# Patient Record
Sex: Female | Born: 1945 | Race: Black or African American | Hispanic: No | Smoking: Former smoker
Health system: Southern US, Community
[De-identification: ages and names within clinical notes are randomized; demographics above are authoritative.]

## PROBLEM LIST (undated history)

## (undated) DIAGNOSIS — K219 Gastro-esophageal reflux disease without esophagitis: Secondary | ICD-10-CM

## (undated) DIAGNOSIS — I10 Essential (primary) hypertension: Secondary | ICD-10-CM

## (undated) DIAGNOSIS — E785 Hyperlipidemia, unspecified: Secondary | ICD-10-CM

## (undated) DIAGNOSIS — E119 Type 2 diabetes mellitus without complications: Secondary | ICD-10-CM

## (undated) DIAGNOSIS — M19011 Primary osteoarthritis, right shoulder: Secondary | ICD-10-CM

## (undated) DIAGNOSIS — C801 Malignant (primary) neoplasm, unspecified: Secondary | ICD-10-CM

## (undated) DIAGNOSIS — M19012 Primary osteoarthritis, left shoulder: Secondary | ICD-10-CM

## (undated) HISTORY — DX: Hyperlipidemia, unspecified: E78.5

## (undated) HISTORY — DX: Essential (primary) hypertension: I10

## (undated) HISTORY — DX: Primary osteoarthritis, right shoulder: M19.012

## (undated) HISTORY — PX: NO PAST SURGERIES: SHX2092

## (undated) HISTORY — DX: Primary osteoarthritis, right shoulder: M19.011

## (undated) HISTORY — DX: Type 2 diabetes mellitus without complications: E11.9

## (undated) HISTORY — PX: FRACTURE SURGERY: SHX138

---

## 1999-09-25 ENCOUNTER — Encounter: Admission: RE | Admit: 1999-09-25 | Discharge: 1999-09-25 | Payer: Self-pay | Admitting: Family Medicine

## 1999-09-25 ENCOUNTER — Encounter: Payer: Self-pay | Admitting: Family Medicine

## 2003-04-10 LAB — HM COLONOSCOPY: HM Colonoscopy: NORMAL

## 2006-11-08 HISTORY — PX: FOOT FRACTURE SURGERY: SHX645

## 2006-11-26 ENCOUNTER — Emergency Department (HOSPITAL_COMMUNITY): Admission: EM | Admit: 2006-11-26 | Discharge: 2006-11-26 | Payer: Self-pay | Admitting: Emergency Medicine

## 2006-12-04 ENCOUNTER — Ambulatory Visit (HOSPITAL_BASED_OUTPATIENT_CLINIC_OR_DEPARTMENT_OTHER): Admission: RE | Admit: 2006-12-04 | Discharge: 2006-12-04 | Payer: Self-pay | Admitting: Orthopedic Surgery

## 2007-03-12 ENCOUNTER — Encounter: Payer: Self-pay | Admitting: Internal Medicine

## 2007-03-12 ENCOUNTER — Other Ambulatory Visit: Admission: RE | Admit: 2007-03-12 | Discharge: 2007-03-12 | Payer: Self-pay | Admitting: Internal Medicine

## 2007-03-12 ENCOUNTER — Ambulatory Visit: Payer: Self-pay | Admitting: Internal Medicine

## 2007-03-12 DIAGNOSIS — K219 Gastro-esophageal reflux disease without esophagitis: Secondary | ICD-10-CM | POA: Insufficient documentation

## 2007-03-13 LAB — CONVERTED CEMR LAB
HDL: 40 mg/dL (ref >39.0)
LDL Cholesterol: 128 mg/dL — ABNORMAL HIGH (ref 0–99)
Triglycerides: 103 mg/dL (ref 0–149)
VLDL: 21 mg/dL (ref 0–40)

## 2007-03-19 ENCOUNTER — Encounter (INDEPENDENT_AMBULATORY_CARE_PROVIDER_SITE_OTHER): Payer: Self-pay | Admitting: *Deleted

## 2007-03-20 ENCOUNTER — Ambulatory Visit (HOSPITAL_COMMUNITY): Admission: RE | Admit: 2007-03-20 | Discharge: 2007-03-20 | Payer: Self-pay | Admitting: Internal Medicine

## 2007-03-24 ENCOUNTER — Encounter (INDEPENDENT_AMBULATORY_CARE_PROVIDER_SITE_OTHER): Payer: Self-pay | Admitting: *Deleted

## 2007-04-14 ENCOUNTER — Ambulatory Visit: Payer: Self-pay | Admitting: Gastroenterology

## 2007-04-24 ENCOUNTER — Encounter: Payer: Self-pay | Admitting: Internal Medicine

## 2007-04-24 ENCOUNTER — Ambulatory Visit: Payer: Self-pay | Admitting: Gastroenterology

## 2007-04-24 LAB — HM COLONOSCOPY: HM Colonoscopy: NORMAL

## 2008-10-13 ENCOUNTER — Ambulatory Visit: Payer: Self-pay | Admitting: Internal Medicine

## 2008-10-13 LAB — CONVERTED CEMR LAB
Albumin: 4 g/dL (ref 3.5–5.2)
Calcium: 9.4 mg/dL (ref 8.4–10.5)
Creatinine, Ser: 0.7 mg/dL (ref 0.4–1.2)
Glucose, Bld: 111 mg/dL — ABNORMAL HIGH (ref 70–99)

## 2008-10-20 ENCOUNTER — Ambulatory Visit (HOSPITAL_COMMUNITY): Admission: RE | Admit: 2008-10-20 | Discharge: 2008-10-20 | Payer: Self-pay | Admitting: Internal Medicine

## 2008-10-21 ENCOUNTER — Encounter: Payer: Self-pay | Admitting: Internal Medicine

## 2009-12-26 ENCOUNTER — Ambulatory Visit: Payer: Self-pay | Admitting: Internal Medicine

## 2009-12-26 ENCOUNTER — Other Ambulatory Visit: Admission: RE | Admit: 2009-12-26 | Discharge: 2009-12-26 | Payer: Self-pay | Admitting: Internal Medicine

## 2009-12-26 DIAGNOSIS — M1990 Unspecified osteoarthritis, unspecified site: Secondary | ICD-10-CM | POA: Insufficient documentation

## 2009-12-27 ENCOUNTER — Ambulatory Visit (HOSPITAL_COMMUNITY): Admission: RE | Admit: 2009-12-27 | Discharge: 2009-12-27 | Payer: Self-pay | Admitting: Internal Medicine

## 2009-12-27 LAB — CONVERTED CEMR LAB
Albumin: 4.4 g/dL (ref 3.5–5.2)
BUN: 18 mg/dL (ref 6–23)
Creatinine, Ser: 0.7 mg/dL (ref 0.4–1.2)
Glucose, Bld: 103 mg/dL — ABNORMAL HIGH (ref 70–99)
Phosphorus: 4.3 mg/dL (ref 2.3–4.6)
TSH: 2.26 microintl units/mL (ref 0.35–5.50)
Total Protein: 7.5 g/dL (ref 6.0–8.3)

## 2009-12-28 ENCOUNTER — Encounter: Payer: Self-pay | Admitting: Internal Medicine

## 2009-12-28 LAB — CONVERTED CEMR LAB: Pap Smear: NEGATIVE

## 2010-04-27 ENCOUNTER — Ambulatory Visit
Admission: RE | Admit: 2010-04-27 | Discharge: 2010-04-27 | Payer: Self-pay | Source: Home / Self Care | Attending: Internal Medicine | Admitting: Internal Medicine

## 2010-04-27 DIAGNOSIS — K644 Residual hemorrhoidal skin tags: Secondary | ICD-10-CM | POA: Insufficient documentation

## 2010-05-09 NOTE — Letter (Signed)
Summary: Results Follow up Letter  [facility]  [address] [place], [place] [address]   Phone: [phone]  Fax: [phone]    12/28/2009 MRN: [id number]  [facility] [address] [place], [place]  [address]  Dear Ms. SPEEDY,  The following are the results of your recent test(s):  Test         Result    Pap Smear:        Normal __X___  Not Normal _____ Comments: ______________________________________________________ Cholesterol: LDL(Bad cholesterol):         Your goal is less than:         HDL (Good cholesterol):       Your goal is more than: Comments:  ______________________________________________________ Mammogram:        Normal _____  Not Normal _____ Comments:  ___________________________________________________________________ Hemoccult:        Normal _____  Not normal _______ Comments:    _____________________________________________________________________ Other Tests:    We routinely do not discuss normal results over the telephone.  If you desire a copy of the results, or you have any questions about this information we can discuss them at your next office visit.   Sincerely,      Tillman Abide, MD

## 2010-05-09 NOTE — Letter (Signed)
Summary: Results Follow up Letter  [facility]  [address] [place], [place] [address]   Phone: [phone]  Fax: [phone]    12/28/2009 MRN: [id number]  [facility] [address] [place], [place]  [address]  Dear Ms. SCHROM,  The following are the results of your recent test(s):  Test         Result    Pap Smear:        Normal _____  Not Normal _____ Comments: ______________________________________________________ Cholesterol: LDL(Bad cholesterol):         Your goal is less than:         HDL (Good cholesterol):       Your goal is more than: Comments:  ______________________________________________________ Mammogram:        Normal __X___  Not Normal _____ Comments: mammo is fine repeat recommended in 1-2 years  ___________________________________________________________________ Hemoccult:        Normal _____  Not normal _______ Comments:    _____________________________________________________________________ Other Tests:    We routinely do not discuss normal results over the telephone.  If you desire a copy of the results, or you have any questions about this information we can discuss them at your next office visit.   Sincerely,      Tillman Abide, MD

## 2010-05-09 NOTE — Assessment & Plan Note (Signed)
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 [employer] hasn't tried any meds Normal ROM though No arm weakness  Glucose 111 last year--not fasting Hasn't eaten in 7 hours today  Due for PAP today  Preventive Screening-Counseling & Management  Alcohol-Tobacco     Smoking Status: current     Smoking Cessation Counseling: yes     Smoke Cessation Stage: contemplative     Packs/Day: <0.25  Comments: only smokes 1 cigarette a day---discussed that she just needs to give that last one up  Allergies: No Known Drug Allergies  Past History:  Past medical, surgical, family and social histories (including risk factors) reviewed for relevance to current acute and chronic problems.  Past Medical History: GERD Osteoarthritis  Past Surgical History: Reviewed history from 03/12/2007 and no changes required. Right foot repair after fracture  8/08  Family History: Reviewed history from 03/12/2007 and no changes required. Dad died from alcoholism  ~ age 18 Mom died in 33's. Had seizure disorder 1 brother and 3 sisters No CAD, DM HTN in 1 sister No breast or colon cancer  Social History: Reviewed history from 03/12/2007 and no changes required. 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 Eyes:  Denies double vision and vision loss-1 eye; recent eye exam. ENT:  Complains of ringing  in ears; denies decreased hearing; Needs some teeth pulled No dentures yest. CV:  Denies chest pain or discomfort, difficulty breathing at night, difficulty breathing while lying down, fainting, lightheadness, palpitations, and shortness of breath with exertion; walks with husband at times. Resp:  Complains of cough; denies shortness of breath; occ cough still smokes about 1 cigarette a day--trying to quit. GI:  Denies abdominal pain, bloody stools, change in bowel habits, dark tarry stools, indigestion, nausea, and vomiting. GU:  Complains of abnormal vaginal bleeding; denies dysuria and incontinence; No sex now--not a problem. MS:  Complains of joint pain and muscle aches; denies joint swelling; just left shoulder. Derm:  Denies lesion(s) and rash. Neuro:  Complains of tingling; denies headaches, numbness, and weakness; brief tingling in right thumb at times---only seconds. Psych:  Denies anxiety and depression. Heme:  Denies abnormal bruising and enlarge lymph nodes. Allergy:  Denies seasonal allergies and sneezing.  Physical Exam  General:  alert and normal appearance.   Eyes:  pupils equal, pupils round, pupils reactive to light, and no injection.   Ears:  R ear normal and L ear normal.   Mouth:  no erythema, no exudates, and no lesions.   Neck:  supple, no masses, no thyromegaly, no carotid bruits, and no cervical lymphadenopathy.   Breasts:  no abnormal thickening, no tenderness, and no adenopathy.  Mild cystic changes R>L Lungs:  normal respiratory effort, no intercostal retractions, no accessory muscle use, and normal breath sounds.  Heart:  normal rate, regular rhythm, no murmur, and no gallop.   Abdomen:  soft, non-tender, and no masses.   Genitalia:  normal introitus, no external lesions, no vaginal or cervical lesions, no friaility or hemorrhage, normal uterus size and position, and no adnexal masses or tenderness.   Msk:  no joint tenderness and no joint swelling.   Has  crepitus with passive ROM of left shoulder Pulses:  1+ in feet Extremities:  no sig edema Neurologic:  alert & oriented X3, strength normal in all extremities, and gait normal.   Skin:  no rashes and no suspicious lesions.   Psych:  normally interactive, good eye contact, not anxious appearing, and not depressed appearing.     Impression & Recommendations:  Problem # 1:  PREVENTIVE HEALTH CARE (ICD-V70.0) Assessment Comment Only Pap done due for mammo will recheck sugar discussed fitness  Problem # 2:  OSTEOARTHRITIS (ICD-715.90) Assessment: New  left shoulder  discussed tylenol before work  Her updated medication list for this problem includes:    Acetaminophen 650 Mg Cr-tabs (Acetaminophen) .Marland Kitchen... 1 tab up to four times daily for shoulder pain  Problem # 3:  GERD (ICD-530.81) Assessment: Unchanged  has been quiet will just check labs  Orders: TLB-Renal Function Panel (80069-RENAL) TLB-Hepatic/Liver Function Pnl (80076-HEPATIC) TLB-TSH (Thyroid Stimulating Hormone) (84443-TSH) Venipuncture (52841)  Complete Medication List: 1)  Centrum Silver Tabs (Multiple vitamins-minerals) .... Take 1 tablet by mouth once a day 2)  Acetaminophen 650 Mg Cr-tabs (Acetaminophen) .Marland Kitchen.. 1 tab up to four times daily for shoulder pain  Other Orders: Radiology Referral (Radiology)  Patient Instructions: 1)  Please try long acting acetominophen (tylenol) especially before work 2)  Please schedule a follow-up appointment in 1 year.  3)  Schedule your mammogram.   Current Allergies (reviewed today): No known allergies

## 2010-05-10 ENCOUNTER — Ambulatory Visit (INDEPENDENT_AMBULATORY_CARE_PROVIDER_SITE_OTHER): Payer: Commercial Managed Care - PPO | Admitting: Family Medicine

## 2010-05-10 ENCOUNTER — Encounter: Payer: Self-pay | Admitting: Family Medicine

## 2010-05-10 DIAGNOSIS — M25519 Pain in unspecified shoulder: Secondary | ICD-10-CM

## 2010-05-10 DIAGNOSIS — D179 Benign lipomatous neoplasm, unspecified: Secondary | ICD-10-CM

## 2010-05-11 NOTE — Assessment & Plan Note (Signed)
Summary: CHECK LUMP ON BACKSIDE/CLE   Vital Signs:  Patient profile:   65 year old female Weight:      183 pounds Temp:     98.4 degrees F oral Pulse rate:   70 / minute Pulse rhythm:   regular BP sitting:   164 / 87  (left arm) Cuff size:   regular  Vitals Entered By: Mervin Hack CMA Duncan Dull) (April 27, 2010 2:48 PM) CC: rash on bottom   History of Present Illness: Had some trouble with hemorrhoids around Thanksgiving Seemed to go down but has a lump there still Right at rectal opening  no pain no bleeding some itching Bowels have been normal  Allergies: No Known Drug Allergies  Past History:  Past medical, surgical, family and social histories (including risk factors) reviewed for relevance to current acute and chronic problems.  Past Medical History: Reviewed history from 12/26/2009 and no changes required. GERD Osteoarthritis  Past Surgical History: Reviewed history from 03/12/2007 and no changes required. Right foot repair after fracture  8/08  Family History: Reviewed history from 03/12/2007 and no changes required. Dad died from alcoholism  ~ age 68 Mom died in 57's. Had seizure disorder 1 brother and 3 sisters No CAD, DM HTN in 1 sister No breast or colon cancer  Social History: Reviewed history from 03/12/2007 and no changes required. 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   Rectal:  small apparent cyst to left of rectum and appears to be distinct from the rectum. Small hemorrhoid tag on the other side Internal shows no additional masses or hemorrhoids   Impression & Recommendations:  Problem # 1:  EXTERNAL HEMORRHOIDS (ICD-455.3) Assessment New small hemorrhoid but also a perirectal cyst that is not inflamed or worrisome at all  Complete Medication List: 1)   Centrum Silver Tabs (Multiple vitamins-minerals) .... Take 1 tablet by mouth once a day 2)  Acetaminophen 650 Mg Cr-tabs (Acetaminophen) .Marland Kitchen.. 1 tab up to four times daily for shoulder pain 3)  Anusol-hc 2.5 % Crea (Hydrocortisone) .... Apply three times a day to rectal area as needed for itching or discomfort  Patient Instructions: 1)  Please schedule a follow-up appointment as needed .  Prescriptions: ANUSOL-HC 2.5 % CREA (HYDROCORTISONE) apply three times a day to rectal area as needed for itching or discomfort  #1 tube x 2   Entered and Authorized by:   Cindee Salt MD   Signed by:   Cindee Salt MD on 04/27/2010   Method used:   Electronically to        [facility] [id number] (retail)       [address] [place], [place]  [address]       Ph: [phone]       Fax: [phone]   RxID:   [id number]    Orders Added: 1)  Est. Patient Level III [84132]    Current Allergies (reviewed today): No known allergies

## 2010-05-17 NOTE — Assessment & Plan Note (Signed)
Summary: CHECK LUMP ON BACK OF NECK/CLE  [facility]   Vital Signs:  Patient profile:   65 year old female Height:      66 inches Weight:      181.75 pounds BMI:     29.44 Temp:     98.5 degrees F oral Pulse rate:   84 / minute Pulse rhythm:   regular BP sitting:   142 / 70  (left arm) Cuff size:   regular  Vitals Entered By: Delilah Shan CMA Duncan Dull) (May 10, 2010 4:13 PM) CC: Check lump on back of neck   History of Present Illness: "Lump on back of neck."  Noted last night.  Not pain. Feeling well o/w except for B shoulder pain. Pain with certain movement, esp above the head.  Works with housekeeping at [employer].  Pain on laterial portion of bilateral shoulders.   Allergies: No Known Drug Allergies  Review of Systems       See HPI.  Otherwise negative.    Physical Exam  General:  no apparent distress normocephalic atraumatic mucous membranes moist neck supple soft mass on upper back right of midline, c/w lipoma B should pain with active range of motion to 90deg- can't abduct above that.  pain with int rotation bilaterally L>R, ext rotation with improved range of motion but still with more pain on the L.  impingment L>R.  No AC joint pain bilaterally.  distally nv intact.    Impression & Recommendations:  Problem # 1:  LIPOMA (ICD-214.9) No tx needed now, notify the clinic if enlarging or tender.  No LA and no work up needed.   Problem # 2:  SHOULDER PAIN, BILATERAL (ICD-719.41) D/w patient [id number].  I would use the tramadol with sedation caution.  No help with tylenol/ibuprofen prev.  If not improved, she may benefit from recheck by Dr. Patsy Lager or her PMD.  Will defer to patient to call back as needed.  She agrees.  The following medications were removed from the medication list:    Acetaminophen 650 Mg Cr-tabs (Acetaminophen) .Marland Kitchen... 1 tab up to four times daily for shoulder pain Her updated medication list for this problem includes:    Tramadol Hcl 50 Mg Tabs (Tramadol  hcl) .Marland Kitchen... 1 by mouth two times a day for pain, sedation caution  Orders: Prescription Created Electronically [phone])  Complete Medication List: 1)  Centrum Silver Tabs (Multiple vitamins-minerals) .... Take 1 tablet by mouth once a day 2)  Tramadol Hcl 50 Mg Tabs (Tramadol hcl) .Marland Kitchen.. 1 by mouth two times a day for pain, sedation caution  Patient Instructions: 1)  Call back if the should pain isn't getting better.  We may need to get you set up with Dr. Patsy Lager here at the clinic. 2)  Try the tramadol for pain.  It may make you drowsy. 3)  Let us know if the spot on your back is getting bigger.  We can get it cut out if needed.   Prescriptions: TRAMADOL HCL 50 MG TABS (TRAMADOL HCL) 1 by mouth two times a day for pain, sedation caution  #60 x 1   Entered and Authorized by:   Crawford Givens MD   Signed by:   Crawford Givens MD on 05/10/2010   Method used:   Electronically to        [facility] [id number] (retail)       [address]       [place]  [place], [place]  [address]       Ph: [phone]       Fax: [phone]   RxID:   [id number]    Orders Added: 1)  Est. Patient Level III [02725] 2)  Prescription Created Electronically [phone]    Current Allergies (reviewed today): No known allergies

## 2010-05-29 ENCOUNTER — Encounter: Payer: Self-pay | Admitting: Internal Medicine

## 2010-05-29 ENCOUNTER — Ambulatory Visit (INDEPENDENT_AMBULATORY_CARE_PROVIDER_SITE_OTHER): Payer: Commercial Managed Care - PPO | Admitting: Internal Medicine

## 2010-05-29 DIAGNOSIS — D179 Benign lipomatous neoplasm, unspecified: Secondary | ICD-10-CM

## 2010-06-06 NOTE — Assessment & Plan Note (Signed)
Summary: ?KNOT ON HIP/CLE   [facility]   Vital Signs:  Patient profile:   65 year old female Weight:      179 pounds Temp:     98.2 degrees F oral Pulse rate:   70 / minute Pulse rhythm:   regular BP sitting:   154 / 71  (left arm) Cuff size:   regular  Vitals Entered By: Mervin Hack CMA Duncan Dull) (May 29, 2010 12:19 PM) CC: knot on hip   History of Present Illness: Now has noted a lump on her right hip in the last few days Not there a couple of weeks ago No falls or trauma  No pain No trouble walking  No decreased ROM  Allergies: No Known Drug Allergies  Past History:  Past medical, surgical, family and social histories (including risk factors) reviewed for relevance to current acute and chronic problems.  Past Medical History: Reviewed history from 12/26/2009 and no changes required. GERD Osteoarthritis  Past Surgical History: Reviewed history from 03/12/2007 and no changes required. Right foot repair after fracture  8/08  Family History: Reviewed history from 03/12/2007 and no changes required. Dad died from alcoholism  ~ age 24 Mom died in 63's. Had seizure disorder 1 brother and 3 sisters No CAD, DM HTN in 1 sister No breast or colon cancer  Social History: Reviewed history from 03/12/2007 and no changes required. 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 Take 1 tablet by mouth once a day 2)  Tramadol Hcl 50 Mg Tabs (Tramadol hcl) .Marland Kitchen.. 1 by mouth two times a day for pain, sedation caution  Patient Instructions: 1)  Please schedule a follow-up appointment in 7-8 months for physical   Orders Added: 1)  Est. Patient Level III [04540]    Current Allergies (reviewed today): No known allergies

## 2010-08-22 NOTE — Op Note (Signed)
Monica Harrington, Monica Harrington               ACCOUNT NO.:  [id number]   MEDICAL RECORD NO.:  [id number]          PATIENT TYPE:  AMB   LOCATION:  [facility]                          FACILITY:  [facility]   PHYSICIAN:  Leonides Grills, M.D.     DATE OF BIRTH:  [birth year]   DATE OF PROCEDURE:  12/04/2006  DATE OF DISCHARGE:                               OPERATIVE REPORT   PREOPERATIVE DIAGNOSIS:  Right calcaneus fracture.   POSTOPERATIVE DIAGNOSIS:  Right calcaneus fracture.   OPERATION:  1. Partial excision, right calcaneus fracture.  2. Right sural nerve neurolysis.  3. Stress x-rays, right foot.   ANESTHESIA:  General.   SURGEON:  Leonides Grills, MD   ASSISTANT:  Evlyn Kanner, P.A.   ESTIMATED BLOOD LOSS:  Minimal.   TOURNIQUET TIME:  Approximately 20 minutes.   COMPLICATIONS:  None.   DISPOSITION:  Stable to PAR.   INDICATIONS:  A 65 year old female who sustained the above injury.  It  was discussed with her that we may do an ORIF versus excision of this  fragment.  We went over the risks, which include infection, nerve or  vessel injury, nonunion, union, hardware rotation, hardware failure,  persistent pain, worse pain, prolonged recovery, stiffness, arthritis,  were all explained, questions encouraged and answered.   OPERATIVE PROCEDURE:  The patient was brought to the operating room and  placed in supine position initially after adequate general endotracheal  tube anesthesia was administered with block as well as Ancef 1 g IV  piggyback.  The patient was then placed in sloppy lateral position on a  beanbag, all bony prominences well-padded with the operative side up.  The right lower extremity was then prepped and draped in a sterile  manner over a proximally-placed thigh tourniquet.  The limb was gravity-  exsanguinated.  The tourniquet was inflated to 290 mmHg.  A longitudinal  incision midline over the right calcaneocuboid joint was then made.  Dissection was carried down through skin.   Hemostasis was obtained.  Sural nerve and its respective branches were encountered.  This was  carefully dissected out and a formal sural nerve neurolysis was  performed to mobilize the nerve and to retract it out of harm's way  lightly.  The peroneus brevis tendon was then encountered and this was  again retracted out of harm's way as well.  We then entered the CC joint  and the fragment was encountered.  This was carefully dissected out and  it was found that this fragment did not involve much of the joint  surface and was carefully dissected out and removed.  Once this was done  we then obtained stress x-rays that showed no instability across the CC  joint and  complete excision of the fragment.  The area was copiously irrigated  with normal saline.  The tourniquet was deflated.  Hemostasis was  obtained.  Subcu was closed with 3-0 Vicryl, skin was closed with 4-0  nylon.  Sterile dressing was applied.  Hard-sole shoe was applied.  The  patient was stable to PAR.      Leonides Grills, M.D.  Electronically Signed     PB/MEDQ  D:  12/04/2006  T:  12/05/2006  Job:  [id number]

## 2011-01-19 LAB — POCT HEMOGLOBIN-HEMACUE
Hemoglobin: 14.5
Operator id: 208731

## 2013-03-30 ENCOUNTER — Other Ambulatory Visit (HOSPITAL_COMMUNITY): Payer: Self-pay | Admitting: Pulmonary Disease

## 2013-03-30 DIAGNOSIS — Z1231 Encounter for screening mammogram for malignant neoplasm of breast: Secondary | ICD-10-CM

## 2013-04-10 ENCOUNTER — Ambulatory Visit (HOSPITAL_COMMUNITY): Payer: Commercial Managed Care - PPO

## 2013-04-17 ENCOUNTER — Other Ambulatory Visit (HOSPITAL_COMMUNITY): Payer: Self-pay | Admitting: Pulmonary Disease

## 2013-04-17 ENCOUNTER — Ambulatory Visit (HOSPITAL_COMMUNITY)
Admission: RE | Admit: 2013-04-17 | Discharge: 2013-04-17 | Disposition: A | Discharge status: Home / Self Care | Payer: PRIVATE HEALTH INSURANCE | Source: Ambulatory Visit | Attending: Pulmonary Disease | Admitting: Pulmonary Disease

## 2013-04-17 DIAGNOSIS — Z1231 Encounter for screening mammogram for malignant neoplasm of breast: Secondary | ICD-10-CM

## 2013-04-17 LAB — HM MAMMOGRAPHY: HM MAMMO: NORMAL

## 2014-05-26 ENCOUNTER — Telehealth: Payer: Self-pay | Admitting: Internal Medicine

## 2014-05-26 NOTE — Telephone Encounter (Signed)
Pt wants to reestablish care with Dr. Silvio Pate.It has been since Feb 2012 since last appointment.  Is it ok to schedule?

## 2014-05-26 NOTE — Telephone Encounter (Signed)
That would be okay

## 2014-05-28 NOTE — Telephone Encounter (Signed)
Appointment 3/18 pt aware

## 2014-06-25 ENCOUNTER — Encounter: Payer: Self-pay | Admitting: Internal Medicine

## 2014-06-25 ENCOUNTER — Ambulatory Visit (INDEPENDENT_AMBULATORY_CARE_PROVIDER_SITE_OTHER): Payer: Medicare Other | Admitting: Internal Medicine

## 2014-06-25 ENCOUNTER — Encounter (INDEPENDENT_AMBULATORY_CARE_PROVIDER_SITE_OTHER): Payer: Self-pay

## 2014-06-25 VITALS — BP 158/86 | HR 74 | Temp 98.5°F | Ht 65.5 in | Wt 179.5 lb

## 2014-06-25 DIAGNOSIS — E785 Hyperlipidemia, unspecified: Secondary | ICD-10-CM | POA: Insufficient documentation

## 2014-06-25 DIAGNOSIS — M19011 Primary osteoarthritis, right shoulder: Secondary | ICD-10-CM

## 2014-06-25 DIAGNOSIS — M19041 Primary osteoarthritis, right hand: Secondary | ICD-10-CM | POA: Insufficient documentation

## 2014-06-25 DIAGNOSIS — Z720 Tobacco use: Secondary | ICD-10-CM

## 2014-06-25 DIAGNOSIS — M19012 Primary osteoarthritis, left shoulder: Secondary | ICD-10-CM

## 2014-06-25 DIAGNOSIS — F17210 Nicotine dependence, cigarettes, uncomplicated: Secondary | ICD-10-CM | POA: Insufficient documentation

## 2014-06-25 DIAGNOSIS — Z0000 Encounter for general adult medical examination without abnormal findings: Secondary | ICD-10-CM | POA: Insufficient documentation

## 2014-06-25 DIAGNOSIS — M19042 Primary osteoarthritis, left hand: Secondary | ICD-10-CM

## 2014-06-25 DIAGNOSIS — Z23 Encounter for immunization: Secondary | ICD-10-CM

## 2014-06-25 DIAGNOSIS — I10 Essential (primary) hypertension: Secondary | ICD-10-CM | POA: Insufficient documentation

## 2014-06-25 MED ORDER — LOSARTAN POTASSIUM-HCTZ 50-12.5 MG PO TABS: 1.0000 | ORAL_TABLET | Freq: Every day | ORAL | Status: DC

## 2014-06-25 MED ORDER — ATORVASTATIN CALCIUM 20 MG PO TABS: 20.0000 mg | ORAL_TABLET | Freq: Every day | ORAL | Status: DC

## 2014-06-25 NOTE — Assessment & Plan Note (Signed)
BP Readings from Last 3 Encounters:  06/25/14 158/86  05/29/10 154/71  05/10/10 142/70   Did well with losartan/HCTZ Will restart and check her in 6 weeks

## 2014-06-25 NOTE — Assessment & Plan Note (Signed)
Wants to restart primary prevention with atorvastatin

## 2014-06-25 NOTE — Progress Notes (Signed)
Subjective:    Patient ID: Monica Harrington, female    DOB: Aug 11, 1945, 69 y.o.   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   No current facility-administered medications on file prior to visit.    No Known Allergies  Past Medical History  Diagnosis Date  . Hyperlipidemia   . Hypertension   . Osteoarthritis of both shoulders     Past Surgical History  Procedure Laterality Date  . No past surgeries    . Foot fracture surgery Right 8/08    Family History  Problem Relation Age of Onset  . Alcohol abuse Father   . Hypertension Sister   . Cancer Neg Hx   . Diabetes Neg Hx   . Heart disease Neg Hx   . Hypertension Sister     History   Social History  . Marital Status: Widowed    Spouse Name: N/A  . Number of Children: N/A  . Years of Education: N/A   Occupational History  . Not on file.   Social History Main Topics  . Smoking status: Current Some Day Smoker  . Smokeless tobacco: Not on file  . Alcohol Use: 1.2 oz/week    2 Cans of beer per week  . Drug Use: Not on file  . Sexual Activity: Not on file   Other Topics Concern  . Not on file   Social History Narrative   Widowed ~2014      No living will   Would want sister to make health care decisions--- [name]   Would accept resuscitation   Not sure about tube feeds   Review of Systems  Constitutional: Negative for fatigue and unexpected weight change.  HENT: Positive for dental problem and tinnitus. Negative for hearing loss.      Needs to get teeth pulled on top  Eyes: Negative for visual disturbance.       No diplopia or unilateral vision loss  Respiratory: Negative for cough, chest tightness and shortness of breath.   Cardiovascular: Negative for chest pain, palpitations and leg swelling.  Gastrointestinal: Positive for constipation. Negative for nausea, vomiting, abdominal pain and blood in stool.       Will rarely need a stool softener  Endocrine: Negative for polydipsia and polyuria.  Genitourinary: Negative for dysuria and hematuria.  Musculoskeletal: Positive for arthralgias. Negative for back pain and joint swelling.  Skin: Negative for rash.  Allergic/Immunologic: Negative for environmental allergies and immunocompromised state.  Neurological: Negative for dizziness, syncope, light-headedness and headaches.  Hematological: Negative for adenopathy. Does not bruise/bleed easily.  Psychiatric/Behavioral: Negative for sleep disturbance and dysphoric mood. The patient is not nervous/anxious.        Objective:   Physical Exam  Constitutional: She is oriented to person, place, and time. She appears well-developed and well-nourished.  HENT:  Mouth/Throat: Oropharynx is clear and moist.  Neck: Normal range of motion. Neck supple. No thyromegaly present.  Cardiovascular: Normal rate, regular rhythm, normal heart sounds and intact distal pulses.  Exam reveals no gallop.  No murmur heard. Pulmonary/Chest: Effort normal and breath sounds normal. No respiratory distress. She has no wheezes. She has no rales.  Abdominal: Soft. There is no tenderness.  Musculoskeletal: She exhibits no edema or tenderness.  Lymphadenopathy:    She has no cervical adenopathy.  Neurological: She is alert and oriented to person, place, and time.  Skin: No rash noted. No erythema.  Psychiatric: She has a normal mood and affect. Her behavior is normal.          Assessment & Plan:

## 2014-06-25 NOTE — Assessment & Plan Note (Signed)
Only smokes every once in a while Urged her to stop completely and she indicated a desire to do so

## 2014-06-25 NOTE — Assessment & Plan Note (Signed)
prevnar today Colon due 2019 mammo due next year---she will set up UTD on tetanus No zostavax due to not having had chicken pox

## 2014-06-25 NOTE — Progress Notes (Signed)
Pre visit review using our clinic review tool, if applicable. No additional management support is needed unless otherwise documented below in the visit note. 

## 2014-06-25 NOTE — Addendum Note (Signed)
Addended by: Helene Shoe on: 06/25/2014 01:21 PM   Modules accepted: Orders

## 2014-06-25 NOTE — Assessment & Plan Note (Signed)
Mild Does well with topical Rx

## 2014-06-28 ENCOUNTER — Telehealth: Payer: Self-pay | Admitting: Internal Medicine

## 2014-06-28 NOTE — Telephone Encounter (Signed)
emmi mailed  °

## 2014-08-06 ENCOUNTER — Encounter: Payer: Self-pay | Admitting: Internal Medicine

## 2014-08-06 ENCOUNTER — Ambulatory Visit (INDEPENDENT_AMBULATORY_CARE_PROVIDER_SITE_OTHER): Payer: Medicare Other | Admitting: Internal Medicine

## 2014-08-06 VITALS — BP 130/60 | HR 69 | Temp 97.9°F | Ht 66.0 in | Wt 182.0 lb

## 2014-08-06 DIAGNOSIS — I10 Essential (primary) hypertension: Secondary | ICD-10-CM

## 2014-08-06 DIAGNOSIS — E785 Hyperlipidemia, unspecified: Secondary | ICD-10-CM

## 2014-08-06 LAB — T4, FREE: FREE T4: 0.62 ng/dL (ref 0.60–1.60)

## 2014-08-06 LAB — COMPREHENSIVE METABOLIC PANEL
ALT: 26 U/L (ref 0–35)
AST: 22 U/L (ref 0–37)
Albumin: 4.2 g/dL (ref 3.5–5.2)
Alkaline Phosphatase: 66 U/L (ref 39–117)
BILIRUBIN TOTAL: 0.3 mg/dL (ref 0.2–1.2)
BUN: 9 mg/dL (ref 6–23)
CALCIUM: 9.6 mg/dL (ref 8.4–10.5)
CHLORIDE: 104 meq/L (ref 96–112)
CO2: 28 mEq/L (ref 19–32)
CREATININE: 0.66 mg/dL (ref 0.40–1.20)
GFR: 114.9 mL/min (ref >60.00)
Glucose, Bld: 130 mg/dL — ABNORMAL HIGH (ref 70–99)
POTASSIUM: 3.8 meq/L (ref 3.5–5.1)
SODIUM: 139 meq/L (ref 135–145)
Total Protein: 7.3 g/dL (ref 6.0–8.3)

## 2014-08-06 LAB — LIPID PANEL
Cholesterol: 120 mg/dL (ref 0–200)
HDL: 33.4 mg/dL — AB (ref >39.00)
LDL Cholesterol: 67 mg/dL (ref 0–99)
NONHDL: 86.6
TRIGLYCERIDES: 96 mg/dL (ref 0.0–149.0)
Total CHOL/HDL Ratio: 4
VLDL: 19.2 mg/dL (ref 0.0–40.0)

## 2014-08-06 LAB — CBC WITH DIFFERENTIAL/PLATELET
Basophils Absolute: 0 10*3/uL (ref 0.0–0.1)
Basophils Relative: 0.4 % (ref 0.0–3.0)
Eosinophils Absolute: 0.2 10*3/uL (ref 0.0–0.7)
Eosinophils Relative: 1.7 % (ref 0.0–5.0)
HEMATOCRIT: 40.1 % (ref 36.0–46.0)
Hemoglobin: 13.1 g/dL (ref 12.0–15.0)
LYMPHS ABS: 3.1 10*3/uL (ref 0.7–4.0)
LYMPHS PCT: 26.6 % (ref 12.0–46.0)
MCHC: 32.8 g/dL (ref 30.0–36.0)
MCV: 90.5 fl (ref 78.0–100.0)
Monocytes Absolute: 0.6 10*3/uL (ref 0.1–1.0)
Monocytes Relative: 5.4 % (ref 3.0–12.0)
NEUTROS ABS: 7.6 10*3/uL (ref 1.4–7.7)
NEUTROS PCT: 65.9 % (ref 43.0–77.0)
Platelets: 367 10*3/uL (ref 150.0–400.0)
RBC: 4.43 Mil/uL (ref 3.87–5.11)
RDW: 13.3 % (ref 11.5–15.5)
WBC: 11.5 10*3/uL — ABNORMAL HIGH (ref 4.0–10.5)

## 2014-08-06 NOTE — Progress Notes (Signed)
   Subjective:    Patient ID: Monica Harrington, female    DOB: 10-Mar-1946, 69 y.o.   MRN: [id number]  HPI Here for follow up of BP and cholesterol No problems going back on meds  Hasn't been checking BP but feels better No headaches No chest pain No SOB No dizziness or syncope Tries to walk with   Did stop smoking ~2 weeks ago  Current Outpatient Prescriptions on File Prior to Visit  Medication Sig Dispense Refill  . atorvastatin (LIPITOR) 20 MG tablet Take 1 tablet (20 mg total) by mouth daily. 90 tablet 3  . losartan-hydrochlorothiazide (HYZAAR) 50-12.5 MG per tablet Take 1 tablet by mouth daily. 90 tablet 3  . Multiple Vitamins-Minerals (CENTRUM SILVER ULTRA WOMENS PO) Take 1 tablet by mouth daily.     No current facility-administered medications on file prior to visit.    No Known Allergies  Past Medical History  Diagnosis Date  . Hyperlipidemia   . Hypertension   . Osteoarthritis of both shoulders     Past Surgical History  Procedure Laterality Date  . No past surgeries    . Foot fracture surgery Right 8/08    Family History  Problem Relation Age of Onset  . Alcohol abuse Father   . Hypertension Sister   . Cancer Neg Hx   . Diabetes Neg Hx   . Heart disease Neg Hx   . Hypertension Sister     History   Social History  . Marital Status: Widowed    Spouse Name: N/A  . Number of Children: N/A  . Years of Education: N/A   Occupational History  . Not on file.   Social History Main Topics  . Smoking status: Current Some Day Smoker  . Smokeless tobacco: Never Used  . Alcohol Use: 1.2 oz/week    2 Cans of beer per week  . Drug Use: Not on file  . Sexual Activity: Not on file   Other Topics Concern  . Not on file   Social History Narrative   Widowed ~2014      No living will   Would want sister to make health care decisions--- [name]   Would accept resuscitation   Not sure about tube feeds   Review of Systems No myalgias Appetite is very  good Tries to be careful with eating Weight stable Sleeps well    Objective:   Physical Exam  Constitutional: She appears well-developed and well-nourished. No distress.  Neck: Normal range of motion. Neck supple. No thyromegaly present.  Cardiovascular: Normal rate, regular rhythm, normal heart sounds and intact distal pulses.  Exam reveals no gallop.   No murmur heard. Pulmonary/Chest: Effort normal and breath sounds normal. No respiratory distress. She has no wheezes. She has no rales.  Musculoskeletal: She exhibits no edema.  Lymphadenopathy:    She has no cervical adenopathy.  Psychiatric: She has a normal mood and affect. Her behavior is normal.          Assessment & Plan:

## 2014-08-06 NOTE — Progress Notes (Signed)
Pre visit review using our clinic review tool, if applicable. No additional management support is needed unless otherwise documented below in the visit note. 

## 2014-08-06 NOTE — Assessment & Plan Note (Signed)
BP Readings from Last 3 Encounters:  08/06/14 130/60  06/25/14 158/86  05/29/10 154/71   Fine back on her med Due for labs

## 2014-08-06 NOTE — Assessment & Plan Note (Signed)
No problems with statin for primary prevention 

## 2014-08-09 ENCOUNTER — Encounter: Payer: Self-pay | Admitting: *Deleted

## 2014-10-20 ENCOUNTER — Encounter: Payer: Self-pay | Admitting: Gastroenterology

## 2015-06-11 ENCOUNTER — Other Ambulatory Visit: Payer: Self-pay | Admitting: Internal Medicine

## 2015-06-13 NOTE — Telephone Encounter (Signed)
Rxs sent electronically.  

## 2015-08-10 ENCOUNTER — Ambulatory Visit (INDEPENDENT_AMBULATORY_CARE_PROVIDER_SITE_OTHER): Payer: Medicare Other | Admitting: Internal Medicine

## 2015-08-10 ENCOUNTER — Encounter: Payer: Self-pay | Admitting: Internal Medicine

## 2015-08-10 VITALS — BP 112/66 | HR 66 | Temp 98.3°F | Ht 65.5 in | Wt 179.8 lb

## 2015-08-10 DIAGNOSIS — E785 Hyperlipidemia, unspecified: Secondary | ICD-10-CM | POA: Diagnosis not present

## 2015-08-10 DIAGNOSIS — Z7189 Other specified counseling: Secondary | ICD-10-CM | POA: Insufficient documentation

## 2015-08-10 DIAGNOSIS — K219 Gastro-esophageal reflux disease without esophagitis: Secondary | ICD-10-CM | POA: Diagnosis not present

## 2015-08-10 DIAGNOSIS — Z0000 Encounter for general adult medical examination without abnormal findings: Secondary | ICD-10-CM | POA: Diagnosis not present

## 2015-08-10 DIAGNOSIS — Z23 Encounter for immunization: Secondary | ICD-10-CM

## 2015-08-10 DIAGNOSIS — I10 Essential (primary) hypertension: Secondary | ICD-10-CM | POA: Diagnosis not present

## 2015-08-10 LAB — CBC WITH DIFFERENTIAL/PLATELET
BASOS PCT: 0.2 % (ref 0.0–3.0)
Basophils Absolute: 0 10*3/uL (ref 0.0–0.1)
EOS ABS: 0.1 10*3/uL (ref 0.0–0.7)
EOS PCT: 1 % (ref 0.0–5.0)
HCT: 40.9 % (ref 36.0–46.0)
Hemoglobin: 13.3 g/dL (ref 12.0–15.0)
LYMPHS PCT: 24.5 % (ref 12.0–46.0)
Lymphs Abs: 3.5 10*3/uL (ref 0.7–4.0)
MCHC: 32.6 g/dL (ref 30.0–36.0)
MCV: 90.4 fl (ref 78.0–100.0)
Monocytes Absolute: 0.8 10*3/uL (ref 0.1–1.0)
Monocytes Relative: 5.6 % (ref 3.0–12.0)
Neutro Abs: 9.7 10*3/uL — ABNORMAL HIGH (ref 1.4–7.7)
Neutrophils Relative %: 68.7 % (ref 43.0–77.0)
Platelets: 361 10*3/uL (ref 150.0–400.0)
RBC: 4.52 Mil/uL (ref 3.87–5.11)
RDW: 14.1 % (ref 11.5–15.5)
WBC: 14.1 10*3/uL — ABNORMAL HIGH (ref 4.0–10.5)

## 2015-08-10 LAB — LIPID PANEL
Cholesterol: 142 mg/dL (ref 0–200)
HDL: 35.6 mg/dL — ABNORMAL LOW (ref >39.00)
LDL CALC: 87 mg/dL (ref 0–99)
NonHDL: 106.41
TRIGLYCERIDES: 99 mg/dL (ref 0.0–149.0)
Total CHOL/HDL Ratio: 4
VLDL: 19.8 mg/dL (ref 0.0–40.0)

## 2015-08-10 LAB — COMPREHENSIVE METABOLIC PANEL
ALBUMIN: 4.4 g/dL (ref 3.5–5.2)
ALT: 21 U/L (ref 0–35)
AST: 20 U/L (ref 0–37)
Alkaline Phosphatase: 73 U/L (ref 39–117)
BUN: 12 mg/dL (ref 6–23)
CALCIUM: 9.9 mg/dL (ref 8.4–10.5)
CHLORIDE: 102 meq/L (ref 96–112)
CO2: 26 mEq/L (ref 19–32)
CREATININE: 0.7 mg/dL (ref 0.40–1.20)
GFR: 107.03 mL/min (ref >60.00)
Glucose, Bld: 266 mg/dL — ABNORMAL HIGH (ref 70–99)
POTASSIUM: 4.5 meq/L (ref 3.5–5.1)
Sodium: 137 mEq/L (ref 135–145)
Total Bilirubin: 0.3 mg/dL (ref 0.2–1.2)
Total Protein: 7.6 g/dL (ref 6.0–8.3)

## 2015-08-10 LAB — T4, FREE: Free T4: 0.7 ng/dL (ref 0.60–1.60)

## 2015-08-10 NOTE — Assessment & Plan Note (Signed)
See social history Has blank forms but hasn't done

## 2015-08-10 NOTE — Assessment & Plan Note (Signed)
BP Readings from Last 3 Encounters:  08/10/15 112/66  08/06/14 130/60  06/25/14 158/86   Good control No change needed

## 2015-08-10 NOTE — Addendum Note (Signed)
Addended by: Pilar Grammes on: 08/10/2015 10:38 AM   Modules accepted: Orders

## 2015-08-10 NOTE — Assessment & Plan Note (Signed)
Mild Rarely uses OTC ranitidine

## 2015-08-10 NOTE — Progress Notes (Signed)
Subjective:    Patient ID: MYKENNA SERVICE, female    DOB: 09/29/1945, 70 y.o.   MRN: [id number]  HPI Here for Medicare wellness and follow up of chronic medical conditions Reviewed form and advanced directives Reviewed other doctors-- none Went back to smoking---rare, only ~2 cigarettes a week. Counseled. No alcohol Vision and hearing are okay Independent with all instrumental ADLs--misunderstood form No falls No depression or anhedonia No hospitalizations or procedures No apparent cognitive problems  No chest pain No SOB No palpitations No edema No dizziness or syncope  No problems with statin No myalgias or GI problems She walks every day  Still has some shoulder pain Mostly just uses aspercreme Doesn't limit her activities  Current Outpatient Prescriptions on File Prior to Visit  Medication Sig Dispense Refill  . atorvastatin (LIPITOR) 20 MG tablet TAKE 1 TABLET EVERY DAY 90 tablet 0  . losartan-hydrochlorothiazide (HYZAAR) 50-12.5 MG tablet TAKE 1 TABLET BY MOUTH DAILY. 90 tablet 0  . Multiple Vitamins-Minerals (CENTRUM SILVER ULTRA WOMENS PO) Take 1 tablet by mouth daily.     No current facility-administered medications on file prior to visit.    No Known Allergies  Past Medical History  Diagnosis Date  . Hyperlipidemia   . Hypertension   . Osteoarthritis of both shoulders     Past Surgical History  Procedure Laterality Date  . No past surgeries    . Foot fracture surgery Right 8/08    Family History  Problem Relation Age of Onset  . Alcohol abuse Father   . Hypertension Sister   . Cancer Neg Hx   . Diabetes Neg Hx   . Heart disease Neg Hx   . Hypertension Sister     Social History   Social History  . Marital Status: Widowed    Spouse Name: N/A  . Number of Children: N/A  . Years of Education: N/A   Occupational History  . Not on file.   Social History Main Topics  . Smoking status: Current Some Day Smoker    Last Attempt to Quit:  07/23/2014  . Smokeless tobacco: Never Used  . Alcohol Use: 1.2 oz/week    2 Cans of beer per week  . Drug Use: Not on file  . Sexual Activity: Not on file   Other Topics Concern  . Not on file   Social History Narrative   Widowed ~2014      No living will   Would want sister to make health care decisions--- [name]   Would accept resuscitation   Not sure about tube feeds--but probably wouldn't want   Review of Systems Has broken tooth-- looking for a dentist Tells me now that she has been diagnosed with hiatal hernia. Rare heartburn---ranitidine helps No trouble swallowing Has ringing in ear---but hearing is okay. No vertigo. Sleeps fine Appetite is good Weight stable Wears seat belt Bowels are fine. No blood in stool Voids okay. No incontinence or hematuria No skin rash or suspicious lesions    Objective:   Physical Exam  Constitutional: She is oriented to person, place, and time. She appears well-developed and well-nourished. No distress.  HENT:  Mouth/Throat: Oropharynx is clear and moist. No oropharyngeal exudate.  Neck: Normal range of motion. Neck supple. No thyromegaly present.  Cardiovascular: Normal rate, regular rhythm, normal heart sounds and intact distal pulses.  Exam reveals no gallop.   No murmur heard. Pulmonary/Chest: Effort normal and breath sounds normal. No respiratory distress. She has no wheezes. She has  no rales.  Abdominal: Soft. There is no tenderness.  Musculoskeletal: She exhibits no edema or tenderness.  Lymphadenopathy:    She has no cervical adenopathy.  Neurological: She is alert and oriented to person, place, and time.  President-- "Daisy Floro, Obama, Bush" [phone] [id number] Recall 2/3  Skin: No rash noted. No erythema.  Psychiatric: She has a normal mood and affect. Her behavior is normal.          Assessment & Plan:

## 2015-08-10 NOTE — Assessment & Plan Note (Signed)
I have personally reviewed the Medicare Annual Wellness questionnaire and have noted 1. The patient's medical and social history 2. Their use of alcohol, tobacco or illicit drugs 3. Their current medications and supplements 4. The patient's functional ability including ADL's, fall risks, home safety risks and hearing or visual             impairment. 5. Diet and physical activities 6. Evidence for depression or mood disorders  The patients weight, height, BMI and visual acuity have been recorded in the chart I have made referrals, counseling and provided education to the patient based review of the above and I have provided the pt with a written personalized care plan for preventive services.  I have provided you with a copy of your personalized plan for preventive services. Please take the time to review along with your updated medication list.  Colon due 1/19 Due for screening mammo--she will set up Pneumovax today Discussed stopping the rare cigarettes

## 2015-08-10 NOTE — Assessment & Plan Note (Signed)
Satisfied with the primary prevention with statin

## 2015-08-10 NOTE — Progress Notes (Signed)
Pre visit review using our clinic review tool, if applicable. No additional management support is needed unless otherwise documented below in the visit note. 

## 2015-08-10 NOTE — Patient Instructions (Addendum)
Please set up your screening mammogram.  DASH Eating Plan DASH stands for "Dietary Approaches to Stop Hypertension." The DASH eating plan is a healthy eating plan that has been shown to reduce high blood pressure (hypertension). Additional health benefits may include reducing the risk of type 2 diabetes mellitus, heart disease, and stroke. The DASH eating plan may also help with weight loss. WHAT DO I NEED TO KNOW ABOUT THE DASH EATING PLAN? For the DASH eating plan, you will follow these general guidelines:  Choose foods with a percent daily value for sodium of less than 5% (as listed on the food label).  Use salt-free seasonings or herbs instead of table salt or sea salt.  Check with your health care provider or pharmacist before using salt substitutes.  Eat lower-sodium products, often labeled as "lower sodium" or "no salt added."  Eat fresh foods.  Eat more vegetables, fruits, and low-fat dairy products.  Choose whole grains. Look for the word "whole" as the first word in the ingredient list.  Choose fish and skinless chicken or [place] more often than red meat. Limit fish, poultry, and meat to 6 oz (170 g) each day.  Limit sweets, desserts, sugars, and sugary drinks.  Choose heart-healthy fats.  Limit cheese to 1 oz (28 g) per day.  Eat more home-cooked food and less restaurant, buffet, and fast food.  Limit fried foods.  Cook foods using methods other than frying.  Limit canned vegetables. If you do use them, rinse them well to decrease the sodium.  When eating at a restaurant, ask that your food be prepared with less salt, or no salt if possible. WHAT FOODS CAN I EAT? Seek help from a dietitian for individual calorie needs. Grains Whole grain or whole wheat bread. Brown rice. Whole grain or whole wheat pasta. Quinoa, bulgur, and whole grain cereals. Low-sodium cereals. Corn or whole wheat flour tortillas. Whole grain cornbread. Whole grain crackers. Low-sodium  crackers. Vegetables Fresh or frozen vegetables (raw, steamed, roasted, or grilled). Low-sodium or reduced-sodium tomato and vegetable juices. Low-sodium or reduced-sodium tomato sauce and paste. Low-sodium or reduced-sodium canned vegetables.  Fruits All fresh, canned (in natural juice), or frozen fruits. Meat and Other Protein Products Ground beef (85% or leaner), grass-fed beef, or beef trimmed of fat. Skinless chicken or [place]. Ground chicken or [place]. Pork trimmed of fat. All fish and seafood. Eggs. Dried beans, peas, or lentils. Unsalted nuts and seeds. Unsalted canned beans. Dairy Low-fat dairy products, such as skim or 1% milk, 2% or reduced-fat cheeses, low-fat ricotta or cottage cheese, or plain low-fat yogurt. Low-sodium or reduced-sodium cheeses. Fats and Oils Tub margarines without trans fats. Light or reduced-fat mayonnaise and salad dressings (reduced sodium). Avocado. Safflower, olive, or canola oils. Natural peanut or almond butter. Other Unsalted popcorn and pretzels. The items listed above may not be a complete list of recommended foods or beverages. Contact your dietitian for more options. WHAT FOODS ARE NOT RECOMMENDED? Grains White bread. White pasta. White rice. Refined cornbread. Bagels and croissants. Crackers that contain trans fat. Vegetables Creamed or fried vegetables. Vegetables in a cheese sauce. Regular canned vegetables. Regular canned tomato sauce and paste. Regular tomato and vegetable juices. Fruits Dried fruits. Canned fruit in light or heavy syrup. Fruit juice. Meat and Other Protein Products Fatty cuts of meat. Ribs, chicken wings, bacon, sausage, bologna, salami, chitterlings, fatback, hot dogs, bratwurst, and packaged luncheon meats. Salted nuts and seeds. Canned beans with salt. Dairy Whole or 2% milk, cream, half-and-half, and  cream cheese. Whole-fat or sweetened yogurt. Full-fat cheeses or blue cheese. Nondairy creamers and whipped toppings.  Processed cheese, cheese spreads, or cheese curds. Condiments Onion and garlic salt, seasoned salt, table salt, and sea salt. Canned and packaged gravies. Worcestershire sauce. Tartar sauce. Barbecue sauce. Teriyaki sauce. Soy sauce, including reduced sodium. Steak sauce. Fish sauce. Oyster sauce. Cocktail sauce. Horseradish. Ketchup and mustard. Meat flavorings and tenderizers. Bouillon cubes. Hot sauce. Tabasco sauce. Marinades. Taco seasonings. Relishes. Fats and Oils Butter, stick margarine, lard, shortening, ghee, and bacon fat. Coconut, palm kernel, or palm oils. Regular salad dressings. Other Pickles and olives. Salted popcorn and pretzels. The items listed above may not be a complete list of foods and beverages to avoid. Contact your dietitian for more information. WHERE CAN I FIND MORE INFORMATION? National Heart, Lung, and Blood Institute: www.nhlbi.nih.gov/health/health-topics/topics/dash/   This information is not intended to replace advice given to you by your health care provider. Make sure you discuss any questions you have with your health care provider.   Document Released: 03/15/2011 Document Revised: 04/16/2014 Document Reviewed: 01/28/2013 Elsevier Interactive Patient Education 2016 Else[employer] Inc.  

## 2015-08-11 ENCOUNTER — Other Ambulatory Visit: Payer: Self-pay | Admitting: Internal Medicine

## 2015-08-11 DIAGNOSIS — R739 Hyperglycemia, unspecified: Secondary | ICD-10-CM

## 2015-09-01 ENCOUNTER — Other Ambulatory Visit (INDEPENDENT_AMBULATORY_CARE_PROVIDER_SITE_OTHER): Payer: Medicare Other

## 2015-09-01 DIAGNOSIS — R739 Hyperglycemia, unspecified: Secondary | ICD-10-CM

## 2015-09-01 LAB — HEMOGLOBIN A1C: Hgb A1c MFr Bld: 11.5 % — ABNORMAL HIGH (ref 4.6–6.5)

## 2015-09-01 LAB — GLUCOSE, RANDOM: Glucose, Bld: 281 mg/dL — ABNORMAL HIGH (ref 70–99)

## 2015-09-01 NOTE — Addendum Note (Signed)
Addended by: Daralene Milch C on: 09/01/2015 10:01 AM   Modules accepted: Orders

## 2015-09-07 ENCOUNTER — Ambulatory Visit (INDEPENDENT_AMBULATORY_CARE_PROVIDER_SITE_OTHER): Payer: Medicare Other | Admitting: Internal Medicine

## 2015-09-07 ENCOUNTER — Encounter: Payer: Self-pay | Admitting: Internal Medicine

## 2015-09-07 VITALS — BP 118/84 | HR 75 | Temp 98.2°F | Wt 177.0 lb

## 2015-09-07 DIAGNOSIS — E1159 Type 2 diabetes mellitus with other circulatory complications: Secondary | ICD-10-CM | POA: Insufficient documentation

## 2015-09-07 DIAGNOSIS — E119 Type 2 diabetes mellitus without complications: Secondary | ICD-10-CM

## 2015-09-07 MED ORDER — METFORMIN HCL 1000 MG PO TABS: 1000.0000 mg | ORAL_TABLET | Freq: Two times a day (BID) | ORAL | Status: DC

## 2015-09-07 NOTE — Patient Instructions (Signed)
Please start the metformin with 1/2 tab twice a day (breakfast and supper). If you have no problems after a week, increase to 1 full tab twice a day. Stop all sugared drinks and concentrated sweets (like candy).

## 2015-09-07 NOTE — Progress Notes (Signed)
Pre visit review using our clinic review tool, if applicable. No additional management support is needed unless otherwise documented below in the visit note. 

## 2015-09-07 NOTE — Progress Notes (Signed)
   Subjective:    Patient ID: Monica Harrington, female    DOB: 1945-06-23, 70 y.o.   MRN: [id number]  HPI Here to discuss new diagnosis of diabetes Here with niece  She admits to eating candy at times Still drinks regular pepsi Does a little walking  Current Outpatient Prescriptions on File Prior to Visit  Medication Sig Dispense Refill  . atorvastatin (LIPITOR) 20 MG tablet TAKE 1 TABLET EVERY DAY 90 tablet 0  . losartan-hydrochlorothiazide (HYZAAR) 50-12.5 MG tablet TAKE 1 TABLET BY MOUTH DAILY. 90 tablet 0  . Multiple Vitamins-Minerals (CENTRUM SILVER ULTRA WOMENS PO) Take 1 tablet by mouth daily.     No current facility-administered medications on file prior to visit.    No Known Allergies  Past Medical History  Diagnosis Date  . Hyperlipidemia   . Hypertension   . Osteoarthritis of both shoulders     Past Surgical History  Procedure Laterality Date  . No past surgeries    . Foot fracture surgery Right 8/08    Family History  Problem Relation Age of Onset  . Alcohol abuse Father   . Hypertension Sister   . Cancer Neg Hx   . Diabetes Neg Hx   . Heart disease Neg Hx   . Hypertension Sister     Social History   Social History  . Marital Status: Widowed    Spouse Name: N/A  . Number of Children: N/A  . Years of Education: N/A   Occupational History  . Not on file.   Social History Main Topics  . Smoking status: Current Some Day Smoker    Last Attempt to Quit: 07/23/2014  . Smokeless tobacco: Never Used  . Alcohol Use: 1.2 oz/week    2 Cans of beer per week  . Drug Use: Not on file  . Sexual Activity: Not on file   Other Topics Concern  . Not on file   Social History Narrative   Widowed ~2014      No living will   Would want sister to make health care decisions--- [name]   Would accept resuscitation   Not sure about tube feeds--but probably wouldn't want   Review of Systems Usually moves bowels 3 times per week Appetite is okay      Objective:   Physical Exam  Psychiatric: She has a normal mood and affect. Her behavior is normal.          Assessment & Plan:

## 2015-09-07 NOTE — Assessment & Plan Note (Signed)
New diagnosis Discussed stopping sugared soda Start metformin Counseling with [facility] Entire 15 minute visit in counseling and care planning

## 2015-09-09 ENCOUNTER — Other Ambulatory Visit: Payer: Self-pay | Admitting: Internal Medicine

## 2015-09-10 ENCOUNTER — Other Ambulatory Visit: Payer: Self-pay | Admitting: Internal Medicine

## 2015-09-23 ENCOUNTER — Encounter: Payer: Self-pay | Admitting: *Deleted

## 2015-09-23 ENCOUNTER — Encounter: Payer: Medicare Other | Attending: Internal Medicine | Admitting: *Deleted

## 2015-09-23 VITALS — BP 140/72 | Ht 65.0 in | Wt 180.2 lb

## 2015-09-23 DIAGNOSIS — E119 Type 2 diabetes mellitus without complications: Secondary | ICD-10-CM | POA: Diagnosis not present

## 2015-09-23 NOTE — Progress Notes (Signed)
Diabetes Self-Management Education  Visit Type: First/Initial  Appt. Start Time: 0835 Appt. End Time: 0945  09/23/2015  [name], identified by name and date of birth, is a 70 y.o. female with a diagnosis of Diabetes: Type 2.   ASSESSMENT  Blood pressure 140/72, height 5\' 5"  (1.651 m), weight 180 lb 3.2 oz (81.738 kg). Body mass index is 29.99 kg/(m^2).      Diabetes Self-Management Education - 09/23/15 1017    Visit Information   Visit Type First/Initial   Initial Visit   Diabetes Type Type 2   Are you currently following a meal plan? No   Are you taking your medications as prescribed? Yes   Date Diagnosed 09/07/15   Health Coping   How would you rate your overall health? Good   Psychosocial Assessment   Patient Belief/Attitude about Diabetes Motivated to manage diabetes  "I feel fine"   Self-care barriers None   Self-management support Doctor's office;Family   Other persons present Family Member  Niece   Patient Concerns Nutrition/Meal planning;Medication   Special Needs None   Preferred Learning Style Auditory   Learning Readiness Change in progress   How often do you need to have someone help you when you read instructions, pamphlets, or other written materials from your doctor or pharmacy? 1 - Never   What is the last grade level you completed in school? 10th   Pre-Education Assessment   Patient understands the diabetes disease and treatment process. Needs Instruction   Patient understands incorporating nutritional management into lifestyle. Needs Instruction   Patient undertands incorporating physical activity into lifestyle. Needs Instruction   Patient understands using medications safely. Needs Instruction   Patient understands monitoring blood glucose, interpreting and using results Needs Instruction   Patient understands prevention, detection, and treatment of acute complications. Needs Instruction   Patient understands prevention, detection, and  treatment of chronic complications. Needs Instruction   Patient understands how to develop strategies to address psychosocial issues. Needs Instruction   Patient understands how to develop strategies to promote health/change behavior. Needs Instruction   Complications   Last HgB A1C per patient/outside source 11.5 %  09/01/15   How often do you check your blood sugar? 0 times/day (not testing)  Provided One Touch Verio Flex meter and instructed on use. BG upon return demonstration was 190 mg/dl at 9:30 am - 2 hrs pp.   Have you had a dilated eye exam in the past 12 months? No   Have you had a dental exam in the past 12 months? No   Are you checking your feet? No   Dietary Intake   Breakfast egg, grits, chicken sausage   Lunch skips   Snack (afternoon) crackers   Snack (evening) baked chicken, pork chop, beef, fish with poataotes, peas, corn, rice. salads   Beverage(s) water, diet soda, no regular sodas since diagnosis; puts sugar in coffee but non in last few weeks   Exercise   Exercise Type Light (walking / raking leaves)   How many days per week to you exercise? 3   How many minutes per day do you exercise? 60   Total minutes per week of exercise 180   Patient Education   Previous Diabetes Education No   Disease state  Definition of diabetes, type 1 and 2, and the diagnosis of diabetes   Nutrition management  Role of diet in the treatment of diabetes and the relationship between the three main macronutrients and blood glucose level  Physical activity and exercise  Role of exercise on diabetes management, blood pressure control and cardiac health.   Medications Reviewed patients medication for diabetes, action, purpose, timing of dose and side effects.   Monitoring Taught/evaluated SMBG meter.;Purpose and frequency of SMBG.;Identified appropriate SMBG and/or A1C goals.   Chronic complications Relationship between chronic complications and blood glucose control   Psychosocial adjustment  Identified and addressed patients feelings and concerns about diabetes   Individualized Goals (developed by patient)   Reducing Risk Decrease medications   Outcomes   Expected Outcomes Demonstrated interest in learning. Expect positive outcomes   Future DMSE 4-6 wks      Individualized Plan for Diabetes Self-Management Training:   Learning Objective:  Patient will have a greater understanding of diabetes self-management. Patient education plan is to attend individual and/or group sessions per assessed needs and concerns.   Plan:   Patient Instructions  Check blood sugars 2 x day before breakfast and 2 hrs after supper every day Exercise: Continue walking  for   60  minutes   3  days a week Eat 3 meals day,  1-2  snacks a day Space meals 4-6 hours apart Don't skip meals Avoid sugar sweetened drinks (soda, tea, coffee, sports drinks, juices) Make an eye doctor appointment Bring blood sugar records to the next class Call your doctor for a prescription for:  1. Meter strips (type) One Touch Verio checking  2 times per day  2. Lancets (type) One Touch Delica checking  2 times per day   Expected Outcomes:  Demonstrated interest in learning. Expect positive outcomes  Education material provided:  General Meal Planning Guidelines Simple Meal Plan Meter - one Touch Verio Flex  If problems or questions, patient to contact team via:  Johny Drilling, [place], [place], CDE [phone]  Future DSME appointment: 4-6 wks  October 20, 2015 for Diabetes Class 1

## 2015-09-23 NOTE — Patient Instructions (Addendum)
Check blood sugars 2 x day before breakfast and 2 hrs after supper every day Exercise: Continue walking  for   60  minutes   3  days a week Eat 3 meals day,  1-2  snacks a day Space meals 4-6 hours apart Don't skip meals Avoid sugar sweetened drinks (soda, tea, coffee, sports drinks, juices) Make an eye doctor appointment Bring blood sugar records to the next class Call your doctor for a prescription for:  1. Meter strips (type) One Touch Verio checking  2 times per day  2. Lancets (type) One Touch Delica checking  2 times per day

## 2015-09-26 ENCOUNTER — Telehealth: Payer: Self-pay

## 2015-09-26 MED ORDER — GLUCOSE BLOOD VI STRP: ORAL_STRIP | Status: DC

## 2015-09-26 MED ORDER — ONETOUCH DELICA LANCETS 33G MISC: Status: DC

## 2015-09-26 NOTE — Telephone Encounter (Signed)
Pt has been to diabetic teaching and was given onetouch verio meter; pt request test strips and delica lancets to [facility]; advised pt done. Last seen 09/07/15.

## 2015-10-20 ENCOUNTER — Encounter: Payer: Self-pay | Admitting: Dietician

## 2015-10-20 ENCOUNTER — Encounter: Payer: Medicare Other | Attending: Internal Medicine | Admitting: Dietician

## 2015-10-20 VITALS — Ht 65.0 in | Wt 175.7 lb

## 2015-10-20 DIAGNOSIS — E119 Type 2 diabetes mellitus without complications: Secondary | ICD-10-CM | POA: Diagnosis not present

## 2015-10-20 NOTE — Progress Notes (Signed)

## 2015-10-27 ENCOUNTER — Encounter: Payer: Medicare Other | Admitting: *Deleted

## 2015-10-27 ENCOUNTER — Encounter: Payer: Self-pay | Admitting: *Deleted

## 2015-10-27 VITALS — Wt 173.9 lb

## 2015-10-27 DIAGNOSIS — E119 Type 2 diabetes mellitus without complications: Secondary | ICD-10-CM | POA: Diagnosis not present

## 2015-10-27 NOTE — Progress Notes (Signed)

## 2015-11-03 ENCOUNTER — Encounter: Payer: Self-pay | Admitting: Dietician

## 2015-11-03 ENCOUNTER — Encounter: Payer: Medicare Other | Admitting: Dietician

## 2015-11-03 VITALS — BP 110/64 | Ht 65.0 in | Wt 173.2 lb

## 2015-11-03 DIAGNOSIS — E119 Type 2 diabetes mellitus without complications: Secondary | ICD-10-CM

## 2015-11-03 NOTE — Progress Notes (Signed)

## 2015-11-04 ENCOUNTER — Encounter: Payer: Self-pay | Admitting: *Deleted

## 2015-12-13 ENCOUNTER — Ambulatory Visit (INDEPENDENT_AMBULATORY_CARE_PROVIDER_SITE_OTHER): Payer: Medicare Other | Admitting: Internal Medicine

## 2015-12-13 ENCOUNTER — Encounter: Payer: Self-pay | Admitting: Internal Medicine

## 2015-12-13 VITALS — BP 122/80 | HR 71 | Temp 98.2°F | Wt 168.0 lb

## 2015-12-13 DIAGNOSIS — Z23 Encounter for immunization: Secondary | ICD-10-CM

## 2015-12-13 DIAGNOSIS — E119 Type 2 diabetes mellitus without complications: Secondary | ICD-10-CM

## 2015-12-13 LAB — HEMOGLOBIN A1C: Hgb A1c MFr Bld: 7.5 % — ABNORMAL HIGH (ref 4.6–6.5)

## 2015-12-13 NOTE — Addendum Note (Signed)
Addended by: Pilar Grammes on: 12/13/2015 01:53 PM   Modules accepted: Orders

## 2015-12-13 NOTE — Progress Notes (Signed)
Subjective:    Patient ID: Monica Harrington, female    DOB: February 18, 1946, 70 y.o.   MRN: [id number]  HPI Here for follow up of diabetes  She is doing fairly well Went to 3 diabetes education classes Did learn a lot about proper eating Has given up candy and sugared soda Mostly fruit for snack Has adjusted to this  Checks sugars every other day Fast[place] usually under 100  Evening numbers under 110 or 120 No hypoglycemic reactions--but will not feel as good under 90  Current Outpatient Prescriptions on File Prior to Visit  Medication Sig Dispense Refill  . atorvastatin (LIPITOR) 20 MG tablet TAKE 1 TABLET EVERY DAY 90 tablet 3  . glucose blood (ONETOUCH VERIO) test strip Check blood sugar twice daily and as instructed. Dx E11.9 100 each 2  . losartan-hydrochlorothiazide (HYZAAR) 50-12.5 MG tablet TAKE 1 TABLET BY MOUTH DAILY. 90 tablet 3  . metFORMIN (GLUCOPHAGE) 1000 MG tablet Take 1 tablet (1,000 mg total) by mouth 2 (two) times daily with a meal. 180 tablet 3  . Multiple Vitamins-Minerals (CENTRUM SILVER ULTRA WOMENS PO) Take 1 tablet by mouth daily.    Monica Harrington DELICA LANCETS [id number] MISC Check blood sugar twice daily and as instructed. Dx E11.9 100 each 2   No current facility-administered medications on file prior to visit.     No Known Allergies  Past Medical History:  Diagnosis Date  . Diabetes mellitus, type 2 (Monica Harrington)   . Hyperlipidemia   . Hypertension   . Osteoarthritis of both shoulders     Past Surgical History:  Procedure Laterality Date  . FOOT FRACTURE SURGERY Right 8/08  . NO PAST SURGERIES      Family History  Problem Relation Age of Onset  . Alcohol abuse Father   . Hypertension Sister   . Cancer Neg Hx   . Diabetes Neg Hx   . Heart disease Neg Hx   . Hypertension Sister     Social History   Social History  . Marital status: Widowed    Spouse name: N/A  . Number of children: N/A  . Years of education: N/A   Occupational History  . Not on  file.   Social History Main Topics  . Smoking status: Current Some Day Smoker    Packs/day: 0.00    Years: 15.00    Types: Cigarettes  . Smokeless tobacco: Never Used     Comment: none in the past week  . Alcohol use 1.2 oz/week    2 Cans of beer per week     Comment: none in past month  . Drug use: Unknown  . Sexual activity: Not on file   Other Topics Concern  . Not on file   Social History Narrative   Widowed ~2014      No living will   Would want sister to make health care decisions--- Monica Harrington   Would accept resuscitation   Not sure about tube feeds--but probably wouldn't want   Review of Systems Has lost 5# Sleeping fine No chest pain No SOB    Objective:   Physical Exam  Constitutional: She appears well-developed and well-nourished. No distress.  Neck: Normal range of motion. Neck supple. No thyromegaly present.  Cardiovascular: Normal rate, regular rhythm, normal heart sounds and intact distal pulses.  Exam reveals no gallop.   No murmur heard. Pulmonary/Chest: Effort normal and breath sounds normal. No respiratory distress. She has no wheezes. She has no rales.  Musculoskeletal: She  exhibits no edema.  Lymphadenopathy:    She has no cervical adenopathy.  Neurological:  Normal sensation in feet  Skin:  No foot lesions  Psychiatric: She has a normal mood and affect. Her behavior is normal.          Assessment & Plan:

## 2015-12-13 NOTE — Progress Notes (Signed)
Pre visit review using our clinic review tool, if applicable. No additional management support is needed unless otherwise documented below in the visit note. 

## 2015-12-13 NOTE — Assessment & Plan Note (Signed)
Seems to now have excellent control Will recheck A1c

## 2016-03-12 ENCOUNTER — Other Ambulatory Visit: Payer: Self-pay | Admitting: Internal Medicine

## 2016-03-12 DIAGNOSIS — Z1231 Encounter for screening mammogram for malignant neoplasm of breast: Secondary | ICD-10-CM

## 2016-04-17 ENCOUNTER — Ambulatory Visit
Admission: RE | Admit: 2016-04-17 | Discharge: 2016-04-17 | Disposition: A | Discharge status: Home / Self Care | Payer: Medicare Other | Source: Ambulatory Visit | Attending: Internal Medicine | Admitting: Internal Medicine

## 2016-04-17 DIAGNOSIS — Z1231 Encounter for screening mammogram for malignant neoplasm of breast: Secondary | ICD-10-CM | POA: Diagnosis not present

## 2016-04-17 DIAGNOSIS — R921 Mammographic calcification found on diagnostic imaging of breast: Secondary | ICD-10-CM | POA: Insufficient documentation

## 2016-04-19 ENCOUNTER — Other Ambulatory Visit: Payer: Self-pay | Admitting: Internal Medicine

## 2016-04-19 DIAGNOSIS — R921 Mammographic calcification found on diagnostic imaging of breast: Secondary | ICD-10-CM

## 2016-04-19 DIAGNOSIS — R928 Other abnormal and inconclusive findings on diagnostic imaging of breast: Secondary | ICD-10-CM

## 2016-05-03 ENCOUNTER — Ambulatory Visit
Admission: RE | Admit: 2016-05-03 | Discharge: 2016-05-03 | Disposition: A | Discharge status: Home / Self Care | Payer: Medicare Other | Source: Ambulatory Visit | Attending: Internal Medicine | Admitting: Internal Medicine

## 2016-05-03 DIAGNOSIS — R921 Mammographic calcification found on diagnostic imaging of breast: Secondary | ICD-10-CM | POA: Insufficient documentation

## 2016-05-03 DIAGNOSIS — R928 Other abnormal and inconclusive findings on diagnostic imaging of breast: Secondary | ICD-10-CM

## 2016-05-04 ENCOUNTER — Other Ambulatory Visit: Payer: Self-pay | Admitting: Internal Medicine

## 2016-05-04 DIAGNOSIS — R921 Mammographic calcification found on diagnostic imaging of breast: Secondary | ICD-10-CM

## 2016-05-04 DIAGNOSIS — R928 Other abnormal and inconclusive findings on diagnostic imaging of breast: Secondary | ICD-10-CM

## 2016-05-17 ENCOUNTER — Ambulatory Visit
Admission: RE | Admit: 2016-05-17 | Discharge: 2016-05-17 | Disposition: A | Discharge status: Home / Self Care | Payer: Medicare Other | Source: Ambulatory Visit | Attending: Internal Medicine | Admitting: Internal Medicine

## 2016-05-17 DIAGNOSIS — R921 Mammographic calcification found on diagnostic imaging of breast: Secondary | ICD-10-CM

## 2016-05-17 DIAGNOSIS — R928 Other abnormal and inconclusive findings on diagnostic imaging of breast: Secondary | ICD-10-CM

## 2016-05-17 DIAGNOSIS — R920 Mammographic microcalcification found on diagnostic imaging of breast: Secondary | ICD-10-CM | POA: Diagnosis not present

## 2016-05-17 DIAGNOSIS — N6022 Fibroadenosis of left breast: Secondary | ICD-10-CM | POA: Insufficient documentation

## 2016-05-17 HISTORY — PX: BREAST BIOPSY: SHX20

## 2016-05-18 LAB — SURGICAL PATHOLOGY

## 2016-06-03 ENCOUNTER — Other Ambulatory Visit: Payer: Self-pay | Admitting: Internal Medicine

## 2016-06-19 ENCOUNTER — Encounter (INDEPENDENT_AMBULATORY_CARE_PROVIDER_SITE_OTHER): Payer: Self-pay

## 2016-06-19 ENCOUNTER — Encounter: Payer: Self-pay | Admitting: Internal Medicine

## 2016-06-19 ENCOUNTER — Ambulatory Visit (INDEPENDENT_AMBULATORY_CARE_PROVIDER_SITE_OTHER): Payer: Medicare Other | Admitting: Internal Medicine

## 2016-06-19 VITALS — BP 112/60 | HR 66 | Temp 97.6°F | Wt 152.5 lb

## 2016-06-19 DIAGNOSIS — I10 Essential (primary) hypertension: Secondary | ICD-10-CM

## 2016-06-19 DIAGNOSIS — K219 Gastro-esophageal reflux disease without esophagitis: Secondary | ICD-10-CM

## 2016-06-19 DIAGNOSIS — L219 Seborrheic dermatitis, unspecified: Secondary | ICD-10-CM | POA: Diagnosis not present

## 2016-06-19 DIAGNOSIS — E119 Type 2 diabetes mellitus without complications: Secondary | ICD-10-CM

## 2016-06-19 DIAGNOSIS — E785 Hyperlipidemia, unspecified: Secondary | ICD-10-CM | POA: Diagnosis not present

## 2016-06-19 LAB — CBC WITH DIFFERENTIAL/PLATELET
BASOS ABS: 0.1 10*3/uL (ref 0.0–0.1)
Basophils Relative: 0.4 % (ref 0.0–3.0)
EOS ABS: 0.1 10*3/uL (ref 0.0–0.7)
Eosinophils Relative: 0.6 % (ref 0.0–5.0)
HCT: 40.8 % (ref 36.0–46.0)
Hemoglobin: 13.2 g/dL (ref 12.0–15.0)
LYMPHS ABS: 3.2 10*3/uL (ref 0.7–4.0)
Lymphocytes Relative: 24.5 % (ref 12.0–46.0)
MCHC: 32.3 g/dL (ref 30.0–36.0)
MCV: 91.5 fl (ref 78.0–100.0)
MONOS PCT: 6.5 % (ref 3.0–12.0)
Monocytes Absolute: 0.9 10*3/uL (ref 0.1–1.0)
NEUTROS ABS: 9 10*3/uL — AB (ref 1.4–7.7)
NEUTROS PCT: 68 % (ref 43.0–77.0)
PLATELETS: 409 10*3/uL — AB (ref 150.0–400.0)
RBC: 4.46 Mil/uL (ref 3.87–5.11)
RDW: 14 % (ref 11.5–15.5)
WBC: 13.2 10*3/uL — ABNORMAL HIGH (ref 4.0–10.5)

## 2016-06-19 LAB — HM DIABETES FOOT EXAM

## 2016-06-19 LAB — COMPREHENSIVE METABOLIC PANEL
ALT: 14 U/L (ref 0–35)
AST: 17 U/L (ref 0–37)
Albumin: 4.5 g/dL (ref 3.5–5.2)
Alkaline Phosphatase: 49 U/L (ref 39–117)
BILIRUBIN TOTAL: 0.3 mg/dL (ref 0.2–1.2)
BUN: 11 mg/dL (ref 6–23)
CO2: 28 meq/L (ref 19–32)
Calcium: 10.3 mg/dL (ref 8.4–10.5)
Chloride: 105 mEq/L (ref 96–112)
Creatinine, Ser: 0.72 mg/dL (ref 0.40–1.20)
GFR: 103.34 mL/min (ref >60.00)
GLUCOSE: 97 mg/dL (ref 70–99)
Potassium: 4 mEq/L (ref 3.5–5.1)
SODIUM: 139 meq/L (ref 135–145)
Total Protein: 7.9 g/dL (ref 6.0–8.3)

## 2016-06-19 LAB — HEMOGLOBIN A1C: HEMOGLOBIN A1C: 6.7 % — AB (ref 4.6–6.5)

## 2016-06-19 LAB — LIPID PANEL
CHOL/HDL RATIO: 3
Cholesterol: 127 mg/dL (ref 0–200)
HDL: 38 mg/dL — ABNORMAL LOW (ref >39.00)
LDL Cholesterol: 69 mg/dL (ref 0–99)
NonHDL: 88.52
TRIGLYCERIDES: 100 mg/dL (ref 0.0–149.0)
VLDL: 20 mg/dL (ref 0.0–40.0)

## 2016-06-19 LAB — T4, FREE: Free T4: 0.71 ng/dL (ref 0.60–1.60)

## 2016-06-19 MED ORDER — HYDROCORTISONE 2.5 % EX CREA: TOPICAL_CREAM | Freq: Three times a day (TID) | CUTANEOUS | 3 refills | Status: DC | PRN

## 2016-06-19 MED ORDER — KETOCONAZOLE 2 % EX SHAM: 1.0000 "application " | MEDICATED_SHAMPOO | CUTANEOUS | 5 refills | Status: DC

## 2016-06-19 NOTE — Assessment & Plan Note (Signed)
No problems with statin 

## 2016-06-19 NOTE — Assessment & Plan Note (Signed)
Seems to have good control Has lost weight Needs eye exam

## 2016-06-19 NOTE — Assessment & Plan Note (Signed)
BP Readings from Last 3 Encounters:  06/19/16 112/60  12/13/15 122/80  11/03/15 110/64   Good control

## 2016-06-19 NOTE — Progress Notes (Signed)
Pre visit review using our clinic review tool, if applicable. No additional management support is needed unless otherwise documented below in the visit note. 

## 2016-06-19 NOTE — Assessment & Plan Note (Signed)
Asked her to use regular meds when symptoms flare

## 2016-06-19 NOTE — Patient Instructions (Addendum)
When your hernia acts up, please take the zantac (ranitidine) 150mg  twice a day for 2-4 weeks till everything settles down completely. Please get your diabetic eye exam.

## 2016-06-19 NOTE — Assessment & Plan Note (Signed)
Will try shampoo and cream To derm if persist

## 2016-06-19 NOTE — Progress Notes (Signed)
Subjective:    Patient ID: Monica Harrington, female    DOB: March 14, 1946, 71 y.o.   MRN: [id number]  HPI Here for follow up of diabetes and other chronic health conditions  Having problems with "hernia acting up" Hiatal hernia----"still messing with me" Does take zantac prn--this does help No swallowing problems Only smokes occasionally  Rash around face and neck Goes back to after breast biopsy Some peeling Some flaking in scalp also  Checking sugars every other day Runs 80-95 No hypoglycemic No foot sores or numbness/pain  No chest pain other than the indigestion No SOB No edema No dizziness  Current Outpatient Prescriptions on File Prior to Visit  Medication Sig Dispense Refill  . atorvastatin (LIPITOR) 20 MG tablet TAKE 1 TABLET EVERY DAY 90 tablet 3  . losartan-hydrochlorothiazide (HYZAAR) 50-12.5 MG tablet TAKE 1 TABLET BY MOUTH DAILY. 90 tablet 3  . metFORMIN (GLUCOPHAGE) 1000 MG tablet Take 1 tablet (1,000 mg total) by mouth 2 (two) times daily with a meal. 180 tablet 3  . Multiple Vitamins-Minerals (CENTRUM SILVER ULTRA WOMENS PO) Take 1 tablet by mouth daily.    [name] DELICA LANCETS 08M MISC Check blood sugar twice daily and as instructed. Dx E11.9 100 each 2  . ONETOUCH VERIO test strip CHECK BLOOD SUGAR TWICE DAILY AND AS INSTRUCTED. DX E11.9 200 each 3   No current facility-administered medications on file prior to visit.     No Known Allergies  Past Medical History:  Diagnosis Date  . Diabetes mellitus, type 2 ([place])   . Hyperlipidemia   . Hypertension   . Osteoarthritis of both shoulders     Past Surgical History:  Procedure Laterality Date  . BREAST BIOPSY Left 05/17/2016   path pending   . FOOT FRACTURE SURGERY Right 8/08  . NO PAST SURGERIES      Family History  Problem Relation Age of Onset  . Alcohol abuse Father   . Hypertension Sister   . Hypertension Sister   . Cancer Neg Hx   . Diabetes Neg Hx   . Heart disease Neg Hx      Social History   Social History  . Marital status: Widowed    Spouse name: N/A  . Number of children: N/A  . Years of education: N/A   Occupational History  . Not on file.   Social History Main Topics  . Smoking status: Current Some Day Smoker    Packs/day: 0.00    Years: 15.00    Types: Cigarettes  . Smokeless tobacco: Never Used     Comment: none in the past week  . Alcohol use 1.2 oz/week    2 Cans of beer per week     Comment: none in past month  . Drug use: Unknown  . Sexual activity: Not on file   Other Topics Concern  . Not on file   Social History Narrative   Widowed ~2014      No living will   Would want sister to make health care decisions--- [name]   Would accept resuscitation   Not sure about tube feeds--but probably wouldn't want   Review of Systems Weight down 15# since 6 months ago Bowels are fine Sleeps well    Objective:   Physical Exam  Constitutional: She appears well-nourished. No distress.  Neck: Neck supple. No thyromegaly present.  Cardiovascular: Normal rate, regular rhythm, normal heart sounds and intact distal pulses.  Exam reveals no gallop.   No murmur heard.  Pulmonary/Chest: Effort normal and breath sounds normal. No respiratory distress. She has no wheezes. She has no rales.  Abdominal: Soft. She exhibits no distension. There is no tenderness. There is no rebound and no guarding.  Musculoskeletal: She exhibits no edema or tenderness.  Lymphadenopathy:    She has no cervical adenopathy.  Neurological:  Normal sensation in plantar feet  Skin:  Macular redness in T distribution of face and around anterior neck No foot lesions  Psychiatric: She has a normal mood and affect. Her behavior is normal.          Assessment & Plan:

## 2016-07-11 LAB — HM DIABETES EYE EXAM

## 2016-07-17 ENCOUNTER — Encounter: Payer: Self-pay | Admitting: Internal Medicine

## 2016-08-13 ENCOUNTER — Ambulatory Visit (INDEPENDENT_AMBULATORY_CARE_PROVIDER_SITE_OTHER): Payer: Medicare Other | Admitting: Internal Medicine

## 2016-08-13 ENCOUNTER — Encounter: Payer: Self-pay | Admitting: Internal Medicine

## 2016-08-13 VITALS — BP 110/60 | HR 64 | Temp 97.7°F | Ht 65.5 in | Wt 151.5 lb

## 2016-08-13 DIAGNOSIS — E785 Hyperlipidemia, unspecified: Secondary | ICD-10-CM

## 2016-08-13 DIAGNOSIS — I10 Essential (primary) hypertension: Secondary | ICD-10-CM | POA: Diagnosis not present

## 2016-08-13 DIAGNOSIS — Z0000 Encounter for general adult medical examination without abnormal findings: Secondary | ICD-10-CM | POA: Diagnosis not present

## 2016-08-13 DIAGNOSIS — H9190 Unspecified hearing loss, unspecified ear: Secondary | ICD-10-CM | POA: Insufficient documentation

## 2016-08-13 DIAGNOSIS — H9193 Unspecified hearing loss, bilateral: Secondary | ICD-10-CM

## 2016-08-13 DIAGNOSIS — E119 Type 2 diabetes mellitus without complications: Secondary | ICD-10-CM

## 2016-08-13 MED ORDER — TETANUS-DIPHTHERIA TOXOIDS TD 5-2 LFU IM INJ: 0.5000 mL | INJECTION | Freq: Once | INTRAMUSCULAR | 0 refills | Status: AC

## 2016-08-13 NOTE — Progress Notes (Signed)
Subjective:    Patient ID: Monica Harrington, female    DOB: Feb 20, 1946, 71 y.o.   MRN: [id number]  HPI Here for Medicare wellness and follow up of chronic medical conditions Reviewed form and advanced directives Reviewed other doctors Has occasional alcohol drink Still smokes a little---counseled. Like once a week Vision is okay--went to eye doctor at [employer] is not good--misses a lot of words No falls No depression or anhedonia Independent with instrumental ADLs No significant problems with memory  Checks sugars every other day Usually 80-100 No low sugar reactions Feet get cold--but no apparent neuropathy  No chest pain or SOB No dizziness or syncope No edema No palpitations  No myalgia or GI problems on the statin  Current Outpatient Prescriptions on File Prior to Visit  Medication Sig Dispense Refill  . atorvastatin (LIPITOR) 20 MG tablet TAKE 1 TABLET EVERY DAY 90 tablet 3  . hydrocortisone 2.5 % cream Apply topically 3 (three) times daily as needed. 28 g 3  . ketoconazole (NIZORAL) 2 % shampoo Apply 1 application topically 2 (two) times a week. 120 mL 5  . losartan-hydrochlorothiazide (HYZAAR) 50-12.5 MG tablet TAKE 1 TABLET BY MOUTH DAILY. 90 tablet 3  . metFORMIN (GLUCOPHAGE) 1000 MG tablet Take 1 tablet (1,000 mg total) by mouth 2 (two) times daily with a meal. 180 tablet 3  . Multiple Vitamins-Minerals (CENTRUM SILVER ULTRA WOMENS PO) Take 1 tablet by mouth daily.    Monica Harrington DELICA LANCETS 37T MISC Check blood sugar twice daily and as instructed. Dx E11.9 100 each 2  . ONETOUCH VERIO test strip CHECK BLOOD SUGAR TWICE DAILY AND AS INSTRUCTED. DX E11.9 200 each 3  . ranitidine (ZANTAC) 150 MG tablet Take 150 mg by mouth 2 (two) times daily as needed for heartburn.     No current facility-administered medications on file prior to visit.     No Known Allergies  Past Medical History:  Diagnosis Date  . Diabetes mellitus, type 2 ([place])   .  Hyperlipidemia   . Hypertension   . Osteoarthritis of both shoulders     Past Surgical History:  Procedure Laterality Date  . BREAST BIOPSY Left 05/17/2016   path pending   . FOOT FRACTURE SURGERY Right 8/08  . NO PAST SURGERIES      Family History  Problem Relation Age of Onset  . Alcohol abuse Father   . Hypertension Sister   . Hypertension Sister   . Cancer Neg Hx   . Diabetes Neg Hx   . Heart disease Neg Hx     Social History   Social History  . Marital status: Widowed    Spouse name: N/A  . Number of children: N/A  . Years of education: N/A   Occupational History  . [employer]    Retired   Social History Main Topics  . Smoking status: Current Some Day Smoker    Packs/day: 0.00    Years: 15.00    Types: Cigarettes  . Smokeless tobacco: Never Used     Comment: none in the past week  . Alcohol use 1.2 oz/week    2 Cans of beer per week     Comment: none in past month  . Drug use: Unknown  . Sexual activity: Not on file   Other Topics Concern  . Not on file   Social History Narrative   Widowed ~2014      No living will   Would want sister to  make health care decisions--- [name]   Would accept resuscitation   Not sure about tube feeds--but probably wouldn't want   Review of Systems Had the breast biopsy in February--- all okay Bowels are okay-- fairly regular with the metformin. No blood Sleeps well Appetite is good. She stays hungry---discussed liberalizing calories of healthy food Has lost 25# over the past year---with the diabetes diagnosis No problems with arthritis Voids okay. No incontinence No recent skin problems Wears seat belt Needs upper teeth pulled  No heartburn or dysphagia    Objective:   Physical Exam  Constitutional: She is oriented to person, place, and time. She appears well-nourished. No distress.  HENT:  Mouth/Throat: Oropharynx is clear and moist. No oropharyngeal exudate.  Bilateral cerumenosis    Neck: No thyromegaly present.  Cardiovascular: Normal rate, regular rhythm, normal heart sounds and intact distal pulses.  Exam reveals no gallop.   No murmur heard. Pulmonary/Chest: Effort normal and breath sounds normal. No respiratory distress. She has no wheezes. She has no rales.  Abdominal: Soft. There is no tenderness.  Musculoskeletal: She exhibits no edema or tenderness.  Lymphadenopathy:    She has no cervical adenopathy.  Neurological: She is alert and oriented to person, place, and time.  President--- "[employer]" [phone] [id number] Recall 3/3  Skin: No rash noted. No erythema.  Psychiatric: She has a normal mood and affect. Her behavior is normal.          Assessment & Plan:

## 2016-08-13 NOTE — Progress Notes (Signed)
Pre visit review using our clinic review tool, if applicable. No additional management support is needed unless otherwise documented below in the visit note. 

## 2016-08-13 NOTE — Assessment & Plan Note (Signed)
Lab Results  Component Value Date   HGBA1C 6.7 (H) 06/19/2016   She has done great! Will defer labs till next time

## 2016-08-13 NOTE — Assessment & Plan Note (Signed)
BP Readings from Last 3 Encounters:  08/13/16 110/60  06/19/16 112/60  12/13/15 122/80   Good control No change

## 2016-08-13 NOTE — Assessment & Plan Note (Signed)
Will clean out the cerumen If no better--she will get hearing evaluation

## 2016-08-13 NOTE — Patient Instructions (Signed)
Please get the tetanus booster at your pharmacy.

## 2016-08-13 NOTE — Assessment & Plan Note (Signed)
No problems with statin 

## 2016-08-13 NOTE — Assessment & Plan Note (Signed)
I have personally reviewed the Medicare Annual Wellness questionnaire and have noted 1. The patient's medical and social history 2. Their use of alcohol, tobacco or illicit drugs 3. Their current medications and supplements 4. The patient's functional ability including ADL's, fall risks, home safety risks and hearing or visual             impairment. 5. Diet and physical activities 6. Evidence for depression or mood disorders  The patients weight, height, BMI and visual acuity have been recorded in the chart I have made referrals, counseling and provided education to the patient based review of the above and I have provided the pt with a written personalized care plan for preventive services.  I have provided you with a copy of your personalized plan for preventive services. Please take the time to review along with your updated medication list.  Recent mammogram Colon due 1/19 Td booster sent Has done great work with lifestyle

## 2016-08-24 ENCOUNTER — Other Ambulatory Visit: Payer: Self-pay | Admitting: Internal Medicine

## 2016-08-30 ENCOUNTER — Other Ambulatory Visit: Payer: Self-pay | Admitting: Internal Medicine

## 2016-09-18 NOTE — Telephone Encounter (Signed)
Pt said she was at [facility] yesterday and was told did not have refills on atorvastatin or Losartan HCTZ; I spoke with Thurmond Butts at [facility] and both meds have available refills and Thurmond Butts will get ready for pick up. Pt will ck with pharmacy later today.

## 2016-09-29 ENCOUNTER — Other Ambulatory Visit: Payer: Self-pay | Admitting: Internal Medicine

## 2017-02-13 ENCOUNTER — Ambulatory Visit (INDEPENDENT_AMBULATORY_CARE_PROVIDER_SITE_OTHER): Payer: Medicare Other | Admitting: Internal Medicine

## 2017-02-13 ENCOUNTER — Encounter: Payer: Self-pay | Admitting: Internal Medicine

## 2017-02-13 VITALS — BP 120/70 | HR 69 | Temp 98.0°F | Wt 162.5 lb

## 2017-02-13 DIAGNOSIS — E119 Type 2 diabetes mellitus without complications: Secondary | ICD-10-CM | POA: Diagnosis not present

## 2017-02-13 DIAGNOSIS — Z23 Encounter for immunization: Secondary | ICD-10-CM

## 2017-02-13 LAB — HEMOGLOBIN A1C: HEMOGLOBIN A1C: 6.4 % (ref 4.6–6.5)

## 2017-02-13 NOTE — Progress Notes (Signed)
Subjective:    Patient ID: Monica Harrington, female    DOB: 1946/03/27, 71 y.o.   MRN: [id number]  HPI Here for follow up of diabetes She is doing well Checking sugars-- 87-100 No hypoglycemic reactions No foot pain or numbness  Has problem with left thumbnail Noticed it 2 months ago Only hurts if she hits it on something  Current Outpatient Medications on File Prior to Visit  Medication Sig Dispense Refill  . atorvastatin (LIPITOR) 20 MG tablet TAKE 1 TABLET BY MOUTH EVERY DAY 90 tablet 3  . hydrocortisone 2.5 % cream Apply topically 3 (three) times daily as needed. 28 g 3  . ketoconazole (NIZORAL) 2 % shampoo Apply 1 application topically 2 (two) times a week. 120 mL 5  . losartan-hydrochlorothiazide (HYZAAR) 50-12.5 MG tablet TAKE 1 TABLET BY MOUTH DAILY. 90 tablet 3  . metFORMIN (GLUCOPHAGE) 1000 MG tablet TAKE 1 TABLET (1,000 MG TOTAL) BY MOUTH 2 (TWO) TIMES DAILY WITH A MEAL. 180 tablet 3  . Multiple Vitamins-Minerals (CENTRUM SILVER ULTRA WOMENS PO) Take 1 tablet by mouth daily.    Monica Harrington DELICA LANCETS 84X MISC CHECK BLOOD SUGAR TWICE DAILY AND AS INSTRUCTED. DX E11.9 100 each 7  . ONETOUCH VERIO test strip CHECK BLOOD SUGAR TWICE DAILY AND AS INSTRUCTED. DX E11.9 200 each 3  . ranitidine (ZANTAC) 150 MG tablet Take 150 mg by mouth 2 (two) times daily as needed for heartburn.     No current facility-administered medications on file prior to visit.     No Known Allergies  Past Medical History:  Diagnosis Date  . Diabetes mellitus, type 2 ([place])   . Hyperlipidemia   . Hypertension   . Osteoarthritis of both shoulders     Past Surgical History:  Procedure Laterality Date  . BREAST BIOPSY Left 05/17/2016   path pending   . FOOT FRACTURE SURGERY Right 8/08  . NO PAST SURGERIES      Family History  Problem Relation Age of Onset  . Alcohol abuse Father   . Hypertension Sister   . Hypertension Sister   . Cancer Neg Hx   . Diabetes Neg Hx   . Heart disease  Neg Hx     Social History   Socioeconomic History  . Marital status: Widowed    Spouse name: Not on file  . Number of children: Not on file  . Years of education: Not on file  . Highest education level: Not on file  Social Needs  . Financial resource strain: Not on file  . Food insecurity - worry: Not on file  . Food insecurity - inability: Not on file  . Transportation needs - medical: Not on file  . Transportation needs - non-medical: Not on file  Occupational History  . 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 Smoking status: Current Some Day Smoker    Packs/day: 0.00    Years: 15.00    Pack years: 0.00    Types: Cigarettes  . Smokeless tobacco: Never Used  . Tobacco comment: none in the past week  Substance and Sexual Activity  . Alcohol use: Yes    Alcohol/week: 1.2 oz    Types: 2 Cans of beer per week    Comment: none in past month  . Drug use: Not on file  . Sexual activity: Not on file  Other Topics Concern  . Not on file  Social History Narrative  Widowed ~2014      No living will   Would want sister to make health care decisions--- Monica Harrington   Would accept resuscitation   Not sure about tube feeds--but probably wouldn't want   Review of Systems Gained back a few pounds Tries to walk regularly with niece [place] Sleeps okay    Objective:   Physical Exam  Constitutional: No distress.  Neck: No thyromegaly present.  Cardiovascular: Normal rate, regular rhythm, normal heart sounds and intact distal pulses. Exam reveals no gallop.  No murmur heard. Pulmonary/Chest: Effort normal and breath sounds normal. No respiratory distress. She has no wheezes. She has no rales.  Musculoskeletal: She exhibits no edema.  Skin:  No foot lesions Distal dystrophy of left thumbnail--fairly normal nail coming from nailbed          Assessment & Plan:

## 2017-02-13 NOTE — Assessment & Plan Note (Signed)
Seems to be doing well Discussed care with lifestyle No complications Will check A1c

## 2017-05-14 ENCOUNTER — Encounter: Payer: Self-pay | Admitting: Emergency Medicine

## 2017-05-14 ENCOUNTER — Emergency Department: Payer: Medicare Other

## 2017-05-14 ENCOUNTER — Emergency Department
Admission: EM | Admit: 2017-05-14 | Discharge: 2017-05-14 | Disposition: A | Discharge status: Home / Self Care | Payer: Medicare Other | Attending: Emergency Medicine | Admitting: Emergency Medicine

## 2017-05-14 ENCOUNTER — Other Ambulatory Visit: Payer: Self-pay

## 2017-05-14 DIAGNOSIS — I10 Essential (primary) hypertension: Secondary | ICD-10-CM | POA: Insufficient documentation

## 2017-05-14 DIAGNOSIS — R110 Nausea: Secondary | ICD-10-CM | POA: Diagnosis not present

## 2017-05-14 DIAGNOSIS — Z79899 Other long term (current) drug therapy: Secondary | ICD-10-CM | POA: Diagnosis not present

## 2017-05-14 DIAGNOSIS — F17210 Nicotine dependence, cigarettes, uncomplicated: Secondary | ICD-10-CM | POA: Diagnosis not present

## 2017-05-14 DIAGNOSIS — R079 Chest pain, unspecified: Secondary | ICD-10-CM | POA: Insufficient documentation

## 2017-05-14 DIAGNOSIS — R112 Nausea with vomiting, unspecified: Secondary | ICD-10-CM | POA: Diagnosis not present

## 2017-05-14 DIAGNOSIS — Z7984 Long term (current) use of oral hypoglycemic drugs: Secondary | ICD-10-CM | POA: Diagnosis not present

## 2017-05-14 DIAGNOSIS — R1012 Left upper quadrant pain: Secondary | ICD-10-CM | POA: Diagnosis not present

## 2017-05-14 DIAGNOSIS — E119 Type 2 diabetes mellitus without complications: Secondary | ICD-10-CM | POA: Insufficient documentation

## 2017-05-14 LAB — BASIC METABOLIC PANEL
Anion gap: 13 (ref 5–15)
BUN: 13 mg/dL (ref 6–20)
CALCIUM: 10.1 mg/dL (ref 8.9–10.3)
CHLORIDE: 99 mmol/L — AB (ref 101–111)
CO2: 24 mmol/L (ref 22–32)
CREATININE: 0.81 mg/dL (ref 0.44–1.00)
GFR calc Af Amer: >60 mL/min (ref >60)
GFR calc non Af Amer: >60 mL/min (ref >60)
Glucose, Bld: 159 mg/dL — ABNORMAL HIGH (ref 65–99)
Potassium: 4 mmol/L (ref 3.5–5.1)
Sodium: 136 mmol/L (ref 135–145)

## 2017-05-14 LAB — CBC
HCT: 41.4 % (ref 35.0–47.0)
Hemoglobin: 13.7 g/dL (ref 12.0–16.0)
MCH: 30 pg (ref 26.0–34.0)
MCHC: 33.2 g/dL (ref 32.0–36.0)
MCV: 90.5 fL (ref 80.0–100.0)
PLATELETS: 419 10*3/uL (ref 150–440)
RBC: 4.58 MIL/uL (ref 3.80–5.20)
RDW: 13.6 % (ref 11.5–14.5)
WBC: 15.1 10*3/uL — ABNORMAL HIGH (ref 3.6–11.0)

## 2017-05-14 LAB — TROPONIN I

## 2017-05-14 MED ORDER — ONDANSETRON 4 MG PO TBDP: 4.0000 mg | ORAL_TABLET | Freq: Three times a day (TID) | ORAL | 0 refills | Status: DC | PRN

## 2017-05-14 MED ORDER — ONDANSETRON 4 MG PO TBDP
4.0000 mg | ORAL_TABLET | Freq: Once | ORAL | Status: AC
  Administered 2017-05-14: 4 mg via ORAL

## 2017-05-14 MED ORDER — ONDANSETRON 4 MG PO TBDP
ORAL_TABLET | ORAL | Status: AC
  Filled 2017-05-14: qty 1

## 2017-05-14 NOTE — ED Provider Notes (Signed)
[facility] Emergency Department Provider Note  Time seen: 6:21 PM  I have reviewed the triage vital signs and the nursing notes.   HISTORY  Chief Complaint Chest Pain    HPI Monica Harrington is a 72 y.o. female with a past medical history of diabetes, hypertension, hyperlipidemia presents to the emergency department for left chest pain/left upper quadrant pain.  According to the patient for the past 2 weeks she has been expensing intermittent left chest pain and upper quadrant pain.  States the pain comes and goes randomly.  Denies any exertional pain contrary to triage note.  Patient states since last night she has felt nauseated and has been coughing and vomited several times.  Denies any diarrhea.  Denies any fever.  Denies any congestion.  Denies any history of heart disease per patient.  Has never had a stress test per patient.  Patient denies any discomfort at this time denies any shortness of breath or diaphoresis at any point.  Describes the pain as moderate when it does occur located between the left chest and left upper quadrant of the abdomen.  Patient states she has been told she has a hiatal hernia which she believes is the cause of her discomfort.  Past Medical History:  Diagnosis Date  . Diabetes mellitus, type 2 ([place])   . Hyperlipidemia   . Hypertension   . Osteoarthritis of both shoulders     Patient Active Problem List   Diagnosis Date Noted  . Hearing loss 08/13/2016  . Seborrheic dermatitis 06/19/2016  . Controlled type 2 diabetes mellitus without complication, without long-term current use of insulin ([place])   . Advance directive discussed with patient 08/10/2015  . Preventative health care 06/25/2014  . Cigarette smoker 06/25/2014  . Hyperlipidemia   . Hypertension   . Osteoarthritis of both shoulders   . EXTERNAL HEMORRHOIDS 04/27/2010  . GERD 03/12/2007    Past Surgical History:  Procedure Laterality Date  . BREAST BIOPSY Left  05/17/2016   path pending   . FOOT FRACTURE SURGERY Right 8/08  . NO PAST SURGERIES      Prior to Admission medications   Medication Sig Start Date End Date Taking? Authorizing Provider  atorvastatin (LIPITOR) 20 MG tablet TAKE 1 TABLET BY MOUTH EVERY DAY 08/30/16   Viviana Simpler I, MD  hydrocortisone 2.5 % cream Apply topically 3 (three) times daily as needed. 06/19/16   Venia Carbon, MD  ketoconazole (NIZORAL) 2 % shampoo Apply 1 application topically 2 (two) times a week. 06/21/16   Venia Carbon, MD  losartan-hydrochlorothiazide (HYZAAR) 50-12.5 MG tablet TAKE 1 TABLET BY MOUTH DAILY. 08/30/16   Venia Carbon, MD  metFORMIN (GLUCOPHAGE) 1000 MG tablet TAKE 1 TABLET (1,000 MG TOTAL) BY MOUTH 2 (TWO) TIMES DAILY WITH A MEAL. 08/24/16   Venia Carbon, MD  Multiple Vitamins-Minerals (CENTRUM SILVER ULTRA WOMENS PO) Take 1 tablet by mouth daily.    [provider]  ONETOUCH DELICA LANCETS 88F MISC CHECK BLOOD SUGAR TWICE DAILY AND AS INSTRUCTED. DX E11.9 10/01/16   Venia Carbon, MD  ONETOUCH VERIO test strip CHECK BLOOD SUGAR TWICE DAILY AND AS INSTRUCTED. DX E11.9 06/04/16   Venia Carbon, MD  ranitidine (ZANTAC) 150 MG tablet Take 150 mg by mouth 2 (two) times daily as needed for heartburn.    [provider]    No Known Allergies  Family History  Problem Relation Age of Onset  . Alcohol abuse Father   .  Hypertension Sister   . Hypertension Sister   . Cancer Neg Hx   . Diabetes Neg Hx   . Heart disease Neg Hx     Social History Social History   Tobacco Use  . Smoking status: Current Some Day Smoker    Packs/day: 0.00    Years: 15.00    Pack years: 0.00    Types: Cigarettes  . Smokeless tobacco: Never Used  . Tobacco comment: none in the past week  Substance Use Topics  . Alcohol use: Yes    Alcohol/week: 1.2 oz    Types: 2 Cans of beer per week    Comment: none in past month  . Drug use: Not on file    Review of  Systems Constitutional: Negative for fever. Eyes: Negative for visual complaints ENT: Negative for recent illness/congestion Cardiovascular: Left lower chest/left upper quadrant abdominal pain Respiratory: Negative for shortness of breath.  Occasional cough since yesterday. Gastrointestinal: Left upper quadrant discomfort, positive for nausea with several episodes of vomiting last vomited early this morning.  Denies diarrhea. Genitourinary: Negative for urinary compaints Musculoskeletal: Negative for leg pain or swelling Skin: Negative for skin complaints  Neurological: Negative for headache All other ROS negative  ____________________________________________   PHYSICAL EXAM:  VITAL SIGNS: ED Triage Vitals  Enc Vitals Group     BP 05/14/17 1643 119/73     Pulse Rate 05/14/17 1643 84     Resp 05/14/17 1643 18     Temp 05/14/17 1643 98.8 F (37.1 C)     Temp Source 05/14/17 1643 Oral     SpO2 05/14/17 1643 97 %     Weight 05/14/17 1643 154 lb (69.9 kg)     Height 05/14/17 1643 5\' 6"  (1.676 m)     Head Circumference --      Peak Flow --      Pain Score 05/14/17 1813 5     Pain Loc --      Pain Edu? --      Excl. in [place]? --     Constitutional: Alert and oriented. Well appearing and in no distress. Eyes: Normal exam ENT   Head: Normocephalic and atraumatic.   Mouth/Throat: Mucous membranes are moist. Cardiovascular: Normal rate, regular rhythm. No murmur Respiratory: Normal respiratory effort without tachypnea nor retractions. Breath sounds are clear  Gastrointestinal: Soft and nontender. No distention.   Musculoskeletal: Nontender with normal range of motion in all extremities.  Neurologic:  Normal speech and language. No gross focal neurologic deficits  Skin:  Skin is warm, dry and intact.  Psychiatric: Mood and affect are normal.   ____________________________________________    EKG  EKG reviewed and interpreted by myself shows a normal sinus rhythm 88 bpm  with a narrow QRS, normal axis, normal intervals, no concerning ST changes identified.  ____________________________________________    RADIOLOGY  Chest x-ray is normal.  ____________________________________________   INITIAL IMPRESSION / ASSESSMENT AND PLAN / ED COURSE  Pertinent labs & imaging results that were available during my care of the patient were reviewed by me and considered in my medical decision making (see chart for details).  Patient presents to the emergency department for left lower chest/left upper quadrant discomfort intermittent over the past 2 weeks.  States she felt nauseated with vomiting last night and this morning.  Denies any shortness of breath or diaphoresis.  Has not had discomfort or nausea since this morning.  Denies any symptoms at this time.  Patient's workup does show moderate leukocytosis  the rest of her labs are largely within normal limits including a negative troponin.  EKG is reassuring.  Chest x-ray is reassuring.  Differential would include ACS although less likely given her normal workup and duration of symptoms, gastritis, reflux, viral illness.  Patient has a completely nontender abdominal exam.  In reviewing the patient's records she has had a moderate leukocytosis persistently over the past 1 year.  Given the patient's normal workup with no current symptoms I believe the patient is safe for discharge home but I did discuss the importance of following up with cardiology in the next several days for a stress test.  Patient agreeable to plan.  I also discussed my normal chest pain return precautions.  We will discharge with a nausea medication to be used if needed.  ____________________________________________   FINAL CLINICAL IMPRESSION(S) / ED DIAGNOSES  Chest pain Nausea    Harvest Dark, MD 05/14/17 1827

## 2017-05-14 NOTE — Discharge Instructions (Signed)
You have been seen in the emergency department today for chest pain. Your workup has shown normal results. As we discussed please follow-up with your primary care physician in the next 1-2 days for recheck. Return to the emergency department for any further chest pain, trouble breathing, or any other symptom personally concerning to yourself.  Please call the number provided for cardiology tomorrow morning to arrange a follow-up appointment as soon as possible for consideration of a stress test.

## 2017-05-14 NOTE — ED Notes (Signed)
First nurse note  States she has been having chest pain since this past weekend  Pain eases off with rest  Also has had some n/v

## 2017-05-14 NOTE — ED Triage Notes (Signed)
Pt with chest pain started on Saturday.

## 2017-05-15 ENCOUNTER — Encounter: Payer: Self-pay | Admitting: Gastroenterology

## 2017-05-17 ENCOUNTER — Telehealth: Payer: Self-pay

## 2017-05-17 NOTE — Telephone Encounter (Signed)
Spoke to pt to see how she was doing after recent ER visit. She said she was doing fine.

## 2017-05-22 ENCOUNTER — Other Ambulatory Visit: Payer: Self-pay | Admitting: Internal Medicine

## 2017-05-22 DIAGNOSIS — Z1231 Encounter for screening mammogram for malignant neoplasm of breast: Secondary | ICD-10-CM

## 2017-05-26 ENCOUNTER — Other Ambulatory Visit: Payer: Self-pay | Admitting: Internal Medicine

## 2017-06-04 ENCOUNTER — Ambulatory Visit
Admission: RE | Admit: 2017-06-04 | Discharge: 2017-06-04 | Disposition: A | Discharge status: Home / Self Care | Payer: Medicare Other | Source: Ambulatory Visit | Attending: Internal Medicine | Admitting: Internal Medicine

## 2017-06-04 DIAGNOSIS — Z1231 Encounter for screening mammogram for malignant neoplasm of breast: Secondary | ICD-10-CM | POA: Insufficient documentation

## 2017-06-21 DIAGNOSIS — R079 Chest pain, unspecified: Secondary | ICD-10-CM | POA: Diagnosis not present

## 2017-06-21 DIAGNOSIS — E782 Mixed hyperlipidemia: Secondary | ICD-10-CM | POA: Diagnosis not present

## 2017-06-21 DIAGNOSIS — K449 Diaphragmatic hernia without obstruction or gangrene: Secondary | ICD-10-CM | POA: Diagnosis not present

## 2017-06-21 DIAGNOSIS — K219 Gastro-esophageal reflux disease without esophagitis: Secondary | ICD-10-CM | POA: Diagnosis not present

## 2017-06-28 DIAGNOSIS — R079 Chest pain, unspecified: Secondary | ICD-10-CM | POA: Diagnosis not present

## 2017-07-03 DIAGNOSIS — K219 Gastro-esophageal reflux disease without esophagitis: Secondary | ICD-10-CM | POA: Diagnosis not present

## 2017-07-03 DIAGNOSIS — I517 Cardiomegaly: Secondary | ICD-10-CM | POA: Diagnosis not present

## 2017-07-03 DIAGNOSIS — I340 Nonrheumatic mitral (valve) insufficiency: Secondary | ICD-10-CM | POA: Diagnosis not present

## 2017-07-03 DIAGNOSIS — R079 Chest pain, unspecified: Secondary | ICD-10-CM | POA: Diagnosis not present

## 2017-07-03 DIAGNOSIS — I10 Essential (primary) hypertension: Secondary | ICD-10-CM | POA: Diagnosis not present

## 2017-08-06 ENCOUNTER — Other Ambulatory Visit: Payer: Self-pay | Admitting: Internal Medicine

## 2017-08-13 ENCOUNTER — Ambulatory Visit: Payer: Medicare Other | Admitting: Internal Medicine

## 2017-08-17 ENCOUNTER — Other Ambulatory Visit: Payer: Self-pay | Admitting: Internal Medicine

## 2017-08-20 ENCOUNTER — Ambulatory Visit (INDEPENDENT_AMBULATORY_CARE_PROVIDER_SITE_OTHER): Payer: Medicare Other | Admitting: Internal Medicine

## 2017-08-20 ENCOUNTER — Telehealth: Payer: Self-pay | Admitting: Internal Medicine

## 2017-08-20 ENCOUNTER — Encounter: Payer: Self-pay | Admitting: Internal Medicine

## 2017-08-20 VITALS — BP 120/64 | HR 69 | Temp 97.6°F | Ht 65.5 in | Wt 162.0 lb

## 2017-08-20 DIAGNOSIS — I10 Essential (primary) hypertension: Secondary | ICD-10-CM | POA: Diagnosis not present

## 2017-08-20 DIAGNOSIS — F17210 Nicotine dependence, cigarettes, uncomplicated: Secondary | ICD-10-CM | POA: Diagnosis not present

## 2017-08-20 DIAGNOSIS — K219 Gastro-esophageal reflux disease without esophagitis: Secondary | ICD-10-CM

## 2017-08-20 DIAGNOSIS — E119 Type 2 diabetes mellitus without complications: Secondary | ICD-10-CM

## 2017-08-20 LAB — HM DIABETES FOOT EXAM

## 2017-08-20 LAB — LIPID PANEL
CHOLESTEROL: 125 mg/dL (ref 0–200)
HDL: 44.2 mg/dL (ref >39.00)
LDL CALC: 61 mg/dL (ref 0–99)
NonHDL: 81.15
Total CHOL/HDL Ratio: 3
Triglycerides: 99 mg/dL (ref 0.0–149.0)
VLDL: 19.8 mg/dL (ref 0.0–40.0)

## 2017-08-20 LAB — HEMOGLOBIN A1C: HEMOGLOBIN A1C: 6.5 % (ref 4.6–6.5)

## 2017-08-20 NOTE — Telephone Encounter (Signed)
Copied from [place] [id number]. Topic: Quick Communication - See Telephone Encounter >> Aug 20, 2017 11:48 AM Ether Griffins B wrote: CRM for notification. See Telephone encounter for: 08/20/17.  Pt calling to make Dr. Silvio Pate the cardiologist placed her on pantoprazole 40 mg

## 2017-08-20 NOTE — Telephone Encounter (Signed)
I added it to her list

## 2017-08-20 NOTE — Assessment & Plan Note (Signed)
Recent ER visit Had cardiac work up ---will try to get the records On acid blocker for a month--but now off again

## 2017-08-20 NOTE — Assessment & Plan Note (Signed)
BP Readings from Last 3 Encounters:  08/20/17 120/64  05/14/17 119/73  02/13/17 120/70   Good control On a new medication from cardiologist--but not sure what (she will call with the name)

## 2017-08-20 NOTE — Telephone Encounter (Signed)
Let her know that that is an acid blocker. Put it on her list if she is still on it

## 2017-08-20 NOTE — Assessment & Plan Note (Signed)
Still seems to have good control Will check labs 

## 2017-08-20 NOTE — Assessment & Plan Note (Signed)
Not ready to stop smoking 

## 2017-08-20 NOTE — Progress Notes (Addendum)
Subjective:    Patient ID: Monica Harrington, female    DOB: 01/05/1946, 72 y.o.   MRN: [id number]  HPI Here for follow up of diabetes and other chronic health conditions [birth year]  Feels good No new concerns  Checks sugars every other day Usually 80-100 No hypoglycemic reactions No numbness, pain or sores in feet. They get cold easy  Reviewed ER visit for chest pain Had follow up with cardiologist at [facility] Has stress test and echo Supposedly okay---goes for follow up again soon No chest pain Started on acid pill and another medication---she doesn't know what they are  Still smokes a few cigarettes a day Just not ready to stop No cough or SOB No dizziness or syncope Keeps up her usual actiivites  Current Outpatient Medications on File Prior to Visit  Medication Sig Dispense Refill  . atorvastatin (LIPITOR) 20 MG tablet TAKE 1 TABLET BY MOUTH EVERY DAY 90 tablet 3  . losartan-hydrochlorothiazide (HYZAAR) 50-12.5 MG tablet TAKE 1 TABLET BY MOUTH DAILY. 90 tablet 3  . metFORMIN (GLUCOPHAGE) 1000 MG tablet TAKE 1 TABLET (1,000 MG TOTAL) BY MOUTH 2 (TWO) TIMES DAILY WITH A MEAL. 180 tablet 0  . Multiple Vitamins-Minerals (CENTRUM SILVER ULTRA WOMENS PO) Take 1 tablet by mouth daily.    Monica Harrington DELICA LANCETS 13K MISC CHECK BLOOD SUGAR TWICE DAILY AND AS INSTRUCTED. DX E11.9 100 each 7  . ONETOUCH VERIO test strip CHECK BLOOD SUGAR TWICE DAILY AND AS INSTRUCTED. DX E11.9 100 each 3   No current facility-administered medications on file prior to visit.     No Known Allergies  Past Medical History:  Diagnosis Date  . Diabetes mellitus, type 2 ([place])   . Hyperlipidemia   . Hypertension   . Osteoarthritis of both shoulders     Past Surgical History:  Procedure Laterality Date  . BREAST BIOPSY Left 05/17/2016   neg  . FOOT FRACTURE SURGERY Right 8/08  . NO PAST SURGERIES      Family History  Problem Relation Age of Onset  . Alcohol abuse Father   .  Hypertension Sister   . Hypertension Sister   . Cancer Neg Hx   . Diabetes Neg Hx   . Heart disease Neg Hx     Social History   Socioeconomic History  . Marital status: Widowed    Spouse name: Not on file  . Number of children: Not on file  . Years of education: Not on file  . Highest education level: Not on file  Occupational History  . 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 Financial resource strain: Not on file  . Food insecurity:    Worry: Not on file    Inability: Not on file  . Transportation needs:    Medical: Not on file    Non-medical: Not on file  Tobacco Use  . Smoking status: Current Some Day Smoker    Packs/day: 0.00    Years: 15.00    Pack years: 0.00    Types: Cigarettes  . Smokeless tobacco: Never Used  . Tobacco comment: none in the past week  Substance and Sexual Activity  . Alcohol use: Yes    Alcohol/week: 1.2 oz    Types: 2 Cans of beer per week    Comment: none in past month  . Drug use: Not on file  . Sexual activity: Not on file  Lifestyle  .  Physical activity:    Days per week: Not on file    Minutes per session: Not on file  . Stress: Not on file  Relationships  . Social connections:    Talks on phone: Not on file    Gets together: Not on file    Attends religious service: Not on file    Active member of club or organization: Not on file    Attends meetings of clubs or organizations: Not on file    Relationship status: Not on file  . Intimate partner violence:    Fear of current or ex partner: Not on file    Emotionally abused: Not on file    Physically abused: Not on file    Forced sexual activity: Not on file  Other Topics Concern  . Not on file  Social History Narrative   Widowed ~2014      No living will   Would want sister to make health care decisions--- Monica Harrington   Would accept resuscitation   Not sure about tube feeds--but probably wouldn't want   Review of  Systems Appetite is good Weight is about the same Sleeps well Does move bowels regularly with the metformin---no incontinence    Objective:   Physical Exam  Constitutional: She appears well-developed. No distress.  Neck: No thyromegaly present.  Cardiovascular: Normal rate, regular rhythm and intact distal pulses. Exam reveals no gallop.  Soft mitral systolic murmur  Pulmonary/Chest: Effort normal and breath sounds normal. No respiratory distress. She has no wheezes. She has no rales.  Abdominal: Soft. There is no tenderness.  Musculoskeletal: She exhibits no edema or tenderness.  Lymphadenopathy:    She has no cervical adenopathy.  Neurological:  Normal sensation in feet  Skin:  No foot lesions  Psychiatric: She has a normal mood and affect. Her behavior is normal.          Assessment & Plan:

## 2017-09-04 ENCOUNTER — Other Ambulatory Visit: Payer: Self-pay | Admitting: Internal Medicine

## 2017-10-03 DIAGNOSIS — K210 Gastro-esophageal reflux disease with esophagitis: Secondary | ICD-10-CM | POA: Diagnosis not present

## 2017-10-03 DIAGNOSIS — R0602 Shortness of breath: Secondary | ICD-10-CM | POA: Diagnosis not present

## 2017-10-03 DIAGNOSIS — I2510 Atherosclerotic heart disease of native coronary artery without angina pectoris: Secondary | ICD-10-CM | POA: Diagnosis not present

## 2017-11-07 ENCOUNTER — Other Ambulatory Visit: Payer: Self-pay | Admitting: Internal Medicine

## 2017-11-14 ENCOUNTER — Other Ambulatory Visit: Payer: Self-pay | Admitting: Internal Medicine

## 2018-02-03 DIAGNOSIS — I10 Essential (primary) hypertension: Secondary | ICD-10-CM | POA: Diagnosis not present

## 2018-02-03 DIAGNOSIS — K210 Gastro-esophageal reflux disease with esophagitis: Secondary | ICD-10-CM | POA: Diagnosis not present

## 2018-02-03 DIAGNOSIS — I2510 Atherosclerotic heart disease of native coronary artery without angina pectoris: Secondary | ICD-10-CM | POA: Diagnosis not present

## 2018-02-03 DIAGNOSIS — R0602 Shortness of breath: Secondary | ICD-10-CM | POA: Diagnosis not present

## 2018-02-03 DIAGNOSIS — E782 Mixed hyperlipidemia: Secondary | ICD-10-CM | POA: Diagnosis not present

## 2018-02-10 ENCOUNTER — Ambulatory Visit (INDEPENDENT_AMBULATORY_CARE_PROVIDER_SITE_OTHER): Payer: Medicare Other | Admitting: Internal Medicine

## 2018-02-10 ENCOUNTER — Encounter: Payer: Self-pay | Admitting: Internal Medicine

## 2018-02-10 VITALS — BP 124/66 | HR 73 | Temp 97.5°F | Ht 65.5 in | Wt 169.0 lb

## 2018-02-10 DIAGNOSIS — E119 Type 2 diabetes mellitus without complications: Secondary | ICD-10-CM | POA: Diagnosis not present

## 2018-02-10 DIAGNOSIS — Z7189 Other specified counseling: Secondary | ICD-10-CM

## 2018-02-10 DIAGNOSIS — K219 Gastro-esophageal reflux disease without esophagitis: Secondary | ICD-10-CM | POA: Diagnosis not present

## 2018-02-10 DIAGNOSIS — E785 Hyperlipidemia, unspecified: Secondary | ICD-10-CM

## 2018-02-10 DIAGNOSIS — Z0000 Encounter for general adult medical examination without abnormal findings: Secondary | ICD-10-CM | POA: Diagnosis not present

## 2018-02-10 DIAGNOSIS — Z23 Encounter for immunization: Secondary | ICD-10-CM | POA: Diagnosis not present

## 2018-02-10 DIAGNOSIS — I10 Essential (primary) hypertension: Secondary | ICD-10-CM

## 2018-02-10 DIAGNOSIS — Z1211 Encounter for screening for malignant neoplasm of colon: Secondary | ICD-10-CM | POA: Diagnosis not present

## 2018-02-10 DIAGNOSIS — M19042 Primary osteoarthritis, left hand: Secondary | ICD-10-CM

## 2018-02-10 DIAGNOSIS — M19041 Primary osteoarthritis, right hand: Secondary | ICD-10-CM

## 2018-02-10 LAB — LIPID PANEL
CHOL/HDL RATIO: 3
CHOLESTEROL: 134 mg/dL (ref 0–200)
HDL: 39.5 mg/dL (ref >39.00)
LDL Cholesterol: 62 mg/dL (ref 0–99)
NonHDL: 94.95
Triglycerides: 164 mg/dL — ABNORMAL HIGH (ref 0.0–149.0)
VLDL: 32.8 mg/dL (ref 0.0–40.0)

## 2018-02-10 LAB — COMPREHENSIVE METABOLIC PANEL
ALBUMIN: 4.6 g/dL (ref 3.5–5.2)
ALT: 21 U/L (ref 0–35)
AST: 24 U/L (ref 0–37)
Alkaline Phosphatase: 62 U/L (ref 39–117)
BILIRUBIN TOTAL: 0.2 mg/dL (ref 0.2–1.2)
BUN: 18 mg/dL (ref 6–23)
CHLORIDE: 102 meq/L (ref 96–112)
CO2: 27 meq/L (ref 19–32)
Calcium: 10.5 mg/dL (ref 8.4–10.5)
Creatinine, Ser: 0.88 mg/dL (ref 0.40–1.20)
GFR: 81.59 mL/min (ref >60.00)
Glucose, Bld: 109 mg/dL — ABNORMAL HIGH (ref 70–99)
Potassium: 3.9 mEq/L (ref 3.5–5.1)
Sodium: 139 mEq/L (ref 135–145)
Total Protein: 8 g/dL (ref 6.0–8.3)

## 2018-02-10 LAB — CBC
HEMATOCRIT: 40.8 % (ref 36.0–46.0)
HEMOGLOBIN: 13.1 g/dL (ref 12.0–15.0)
MCHC: 32.2 g/dL (ref 30.0–36.0)
MCV: 91.5 fl (ref 78.0–100.0)
Platelets: 416 10*3/uL — ABNORMAL HIGH (ref 150.0–400.0)
RBC: 4.46 Mil/uL (ref 3.87–5.11)
RDW: 13.8 % (ref 11.5–15.5)
WBC: 13.1 10*3/uL — AB (ref 4.0–10.5)

## 2018-02-10 LAB — HEMOGLOBIN A1C: Hgb A1c MFr Bld: 6.8 % — ABNORMAL HIGH (ref 4.6–6.5)

## 2018-02-10 NOTE — Assessment & Plan Note (Signed)
No problems with statin 

## 2018-02-10 NOTE — Addendum Note (Signed)
Addended by: Pilar Grammes on: 02/10/2018 12:24 PM   Modules accepted: Orders

## 2018-02-10 NOTE — Assessment & Plan Note (Signed)
Better now and off meds Will restart if recurs

## 2018-02-10 NOTE — Assessment & Plan Note (Signed)
See social history 

## 2018-02-10 NOTE — Assessment & Plan Note (Signed)
BP Readings from Last 3 Encounters:  02/10/18 124/66  08/20/17 120/64  05/14/17 119/73   Good control Will check labs

## 2018-02-10 NOTE — Assessment & Plan Note (Signed)
I have personally reviewed the Medicare Annual Wellness questionnaire and have noted 1. The patient's medical and social history 2. Their use of alcohol, tobacco or illicit drugs 3. Their current medications and supplements 4. The patient's functional ability including ADL's, fall risks, home safety risks and hearing or visual             impairment. 5. Diet and physical activities 6. Evidence for depression or mood disorders  The patients weight, height, BMI and visual acuity have been recorded in the chart I have made referrals, counseling and provided education to the patient based review of the above and I have provided the pt with a written personalized care plan for preventive services.  I have provided you with a copy of your personalized plan for preventive services. Please take the time to review along with your updated medication list.  Flu vaccine today Consider shingrix Discussed exercise Overdue for colonoscopy Mammogram every 1-2 years Discussed giving up the smoking completely

## 2018-02-10 NOTE — Assessment & Plan Note (Signed)
Control still seems fine Will check labs

## 2018-02-10 NOTE — Progress Notes (Signed)
Subjective:    Patient ID: Monica Harrington, female    DOB: 1946/02/02, 72 y.o.   MRN: [id number]  HPI Here for Medicare wellness visit and follow up of chronic health conditions Reviewed form and advanced directives Reviewed other doctors Still smokes some---discussed and has cut down No alcohol Not really exercising--discussed Vision is okay--due for eye exam Hearing is still not good---discussed considering aides No falls No depression or anhedonia  Doesn't drive but does all instrumental ADLs No sig memory problems  Just doing "so so" Has aching in hands----trouble bending them  They get stiff but not sig pain She can move them around and it improves No significant swelling  Checks sugars 3 times a week Usually 80-90---but over 100 at times No hypoglycemic reactions No foot numbness or tingling---just get cold   No chest pain lately Keeps up with Dr Luella Cook check up Stopped the acid pills No heartburn lately--no swallowing No SOB Still coughs fairly regularly--but not every day  Still on statin daily No myalgia or GI problems with this  Current Outpatient Medications on File Prior to Visit  Medication Sig Dispense Refill  . atorvastatin (LIPITOR) 20 MG tablet TAKE 1 TABLET BY MOUTH EVERY DAY 90 tablet 3  . glucose blood (ONETOUCH VERIO) test strip Use to check blood sugar once a day Dx Code E11.9 100 each 3  . losartan-hydrochlorothiazide (HYZAAR) 50-12.5 MG tablet TAKE 1 TABLET BY MOUTH EVERY DAY 90 tablet 3  . metFORMIN (GLUCOPHAGE) 1000 MG tablet TAKE 1 TABLET (1,000 MG TOTAL) BY MOUTH 2 (TWO) TIMES DAILY WITH A MEAL. 180 tablet 3  . Multiple Vitamins-Minerals (CENTRUM SILVER ULTRA WOMENS PO) Take 1 tablet by mouth daily.    [name] DELICA LANCETS 09N MISC CHECK BLOOD SUGAR TWICE DAILY AND AS INSTRUCTED. DX E11.9 100 each 7   No current facility-administered medications on file prior to visit.     No Known Allergies  Past Medical History:    Diagnosis Date  . Diabetes mellitus, type 2 ([place])   . Hyperlipidemia   . Hypertension   . Osteoarthritis of both shoulders     Past Surgical History:  Procedure Laterality Date  . BREAST BIOPSY Left 05/17/2016   neg  . FOOT FRACTURE SURGERY Right 8/08  . NO PAST SURGERIES      Family History  Problem Relation Age of Onset  . Alcohol abuse Father   . Hypertension Sister   . Hypertension Sister   . Cancer Neg Hx   . Diabetes Neg Hx   . Heart disease Neg Hx     Social History   Socioeconomic History  . Marital status: Widowed    Spouse name: Not on file  . Number of children: Not on file  . Years of education: Not on file  . Highest education level: Not on file  Occupational History  . 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 Financial resource strain: Not on file  . Food insecurity:    Worry: Not on file    Inability: Not on file  . Transportation needs:    Medical: Not on file    Non-medical: Not on file  Tobacco Use  . Smoking status: Current Some Day Smoker    Packs/day: 0.00    Years: 15.00    Pack years: 0.00    Types: Cigarettes  . Smokeless tobacco: Never Used  . Tobacco comment: none in the past  week  Substance and Sexual Activity  . Alcohol use: Yes    Alcohol/week: 2.0 standard drinks    Types: 2 Cans of beer per week    Comment: none in past month  . Drug use: Not on file  . Sexual activity: Not on file  Lifestyle  . Physical activity:    Days per week: Not on file    Minutes per session: Not on file  . Stress: Not on file  Relationships  . Social connections:    Talks on phone: Not on file    Gets together: Not on file    Attends religious service: Not on file    Active member of club or organization: Not on file    Attends meetings of clubs or organizations: Not on file    Relationship status: Not on file  . Intimate partner violence:    Fear of current or ex partner: Not on file     Emotionally abused: Not on file    Physically abused: Not on file    Forced sexual activity: Not on file  Other Topics Concern  . Not on file  Social History Narrative   Widowed ~2014      No living will   Would want sister to make health care decisions--- [name]   Would accept resuscitation--but no prolonged life support   Not sure about tube feeds--but wouldn't want if cognitively unaware   Review of Systems Appetite is always good. Weight is up a few pounds Sleeps okay Wears seat belt Needs teeth pulled---no recent dental exam. Needs new dentist No other arthritic pain Bowels are fine--- 2-3 times a day on the metformin. No blood Voids fine. No incontinence No rash or skin ulcers    Objective:   Physical Exam  Constitutional: She is oriented to person, place, and time. She appears well-developed. No distress.  HENT:  Mouth/Throat: Oropharynx is clear and moist. No oropharyngeal exudate.  Neck: No thyromegaly present.  Cardiovascular: Normal rate, regular rhythm, normal heart sounds and intact distal pulses. Exam reveals no gallop.  No murmur heard. Respiratory: Effort normal and breath sounds normal. No respiratory distress. She has no wheezes. She has no rales.  GI: Soft. There is no tenderness.  Musculoskeletal: She exhibits no edema or tenderness.  Lymphadenopathy:    She has no cervical adenopathy.  Neurological: She is alert and oriented to person, place, and time.  President--- "[place]" [phone]? [id number] Recall 3/3  Skin: No rash noted. No erythema.  No foot lesions  Psychiatric: She has a normal mood and affect. Her behavior is normal.           Assessment & Plan:

## 2018-02-10 NOTE — Progress Notes (Signed)
Hearing Screening (Inadequate exam)   Method: Audiometry   125Hz  250Hz  500Hz  1000Hz  2000Hz  3000Hz  4000Hz  6000Hz  8000Hz   Right ear:   0 0 0  0    Left ear:   0 0 0  0      Visual Acuity Screening   Right eye Left eye Both eyes  Without correction:     With correction: 20/25 20/25 20/25

## 2018-02-10 NOTE — Assessment & Plan Note (Signed)
Mild Discussed heat----tylenol prn

## 2018-03-03 DIAGNOSIS — E1165 Type 2 diabetes mellitus with hyperglycemia: Secondary | ICD-10-CM | POA: Diagnosis not present

## 2018-03-03 DIAGNOSIS — Z1211 Encounter for screening for malignant neoplasm of colon: Secondary | ICD-10-CM | POA: Diagnosis not present

## 2018-03-03 DIAGNOSIS — K219 Gastro-esophageal reflux disease without esophagitis: Secondary | ICD-10-CM | POA: Diagnosis not present

## 2018-03-03 DIAGNOSIS — E785 Hyperlipidemia, unspecified: Secondary | ICD-10-CM | POA: Diagnosis not present

## 2018-03-18 ENCOUNTER — Telehealth: Payer: Self-pay

## 2018-03-18 MED ORDER — PANTOPRAZOLE SODIUM 40 MG PO TBEC: 40.0000 mg | DELAYED_RELEASE_TABLET | Freq: Every day | ORAL | 3 refills | Status: DC

## 2018-03-18 NOTE — Telephone Encounter (Signed)
Please send Rx for pantoprazole 40mg  daily 1 year

## 2018-03-18 NOTE — Telephone Encounter (Signed)
Rx sent electronically.  

## 2018-03-18 NOTE — Telephone Encounter (Signed)
Pt was seen for annual exam on 02/10/18 and was not having problem with GERD; pt was advised if GERD reoccurs would restart med. Now pt having difficulty digesting food. GERD flair up. Pt request med to [facility].

## 2018-04-29 ENCOUNTER — Ambulatory Visit: Payer: Medicare Other | Admitting: Certified Registered"

## 2018-04-29 ENCOUNTER — Encounter: Payer: Self-pay | Admitting: *Deleted

## 2018-04-29 ENCOUNTER — Ambulatory Visit
Admission: RE | Admit: 2018-04-29 | Discharge: 2018-04-29 | Disposition: A | Discharge status: Home / Self Care | Payer: Medicare Other | Attending: Internal Medicine | Admitting: Internal Medicine

## 2018-04-29 ENCOUNTER — Encounter
Admission: RE | Disposition: A | Discharge status: Home / Self Care | Payer: Self-pay | Source: Home / Self Care | Attending: Internal Medicine

## 2018-04-29 DIAGNOSIS — K640 First degree hemorrhoids: Secondary | ICD-10-CM | POA: Diagnosis not present

## 2018-04-29 DIAGNOSIS — E119 Type 2 diabetes mellitus without complications: Secondary | ICD-10-CM | POA: Insufficient documentation

## 2018-04-29 DIAGNOSIS — I10 Essential (primary) hypertension: Secondary | ICD-10-CM | POA: Insufficient documentation

## 2018-04-29 DIAGNOSIS — K648 Other hemorrhoids: Secondary | ICD-10-CM | POA: Diagnosis not present

## 2018-04-29 DIAGNOSIS — E785 Hyperlipidemia, unspecified: Secondary | ICD-10-CM | POA: Insufficient documentation

## 2018-04-29 DIAGNOSIS — K5790 Diverticulosis of intestine, part unspecified, without perforation or abscess without bleeding: Secondary | ICD-10-CM | POA: Insufficient documentation

## 2018-04-29 DIAGNOSIS — Z79899 Other long term (current) drug therapy: Secondary | ICD-10-CM | POA: Insufficient documentation

## 2018-04-29 DIAGNOSIS — Z1211 Encounter for screening for malignant neoplasm of colon: Secondary | ICD-10-CM | POA: Insufficient documentation

## 2018-04-29 DIAGNOSIS — Z7984 Long term (current) use of oral hypoglycemic drugs: Secondary | ICD-10-CM | POA: Insufficient documentation

## 2018-04-29 DIAGNOSIS — K5730 Diverticulosis of large intestine without perforation or abscess without bleeding: Secondary | ICD-10-CM | POA: Diagnosis not present

## 2018-04-29 HISTORY — PX: COLONOSCOPY WITH PROPOFOL: SHX5780

## 2018-04-29 LAB — GLUCOSE, CAPILLARY: Glucose-Capillary: 97 mg/dL (ref 70–99)

## 2018-04-29 SURGERY — COLONOSCOPY WITH PROPOFOL
Anesthesia: General

## 2018-04-29 MED ORDER — PROPOFOL 10 MG/ML IV BOLUS
INTRAVENOUS | Status: DC | PRN
  Administered 2018-04-29: 50 mg via INTRAVENOUS

## 2018-04-29 MED ORDER — PROPOFOL 10 MG/ML IV BOLUS
INTRAVENOUS | Status: AC
  Filled 2018-04-29: qty 40

## 2018-04-29 MED ORDER — PROPOFOL 500 MG/50ML IV EMUL
INTRAVENOUS | Status: DC | PRN
  Administered 2018-04-29: 150 ug/kg/min via INTRAVENOUS

## 2018-04-29 MED ORDER — SODIUM CHLORIDE 0.9 % IV SOLN
INTRAVENOUS | Status: DC
  Administered 2018-04-29: 1000 mL via INTRAVENOUS

## 2018-04-29 NOTE — Op Note (Signed)
[facility] Gastroenterology Patient Name: Tomara Youngberg Procedure Date: 04/29/2018 8:50 AM MRN: [id number] Account #: [id number] Date of Birth: Aug 16, 1945 Admit Type: Outpatient Age: 73 Room: [facility] ENDO ROOM 2 Gender: Female Note Status: Finalized Procedure:            Colonoscopy Indications:          Screening for colorectal malignant neoplasm Providers:            Benay Pike. Alice Reichert MD, MD Referring MD:         Venia Carbon (Referring MD) Medicines:            Propofol per Anesthesia Complications:        No immediate complications. Procedure:            Pre-Anesthesia Assessment:                       - The risks and benefits of the procedure and the                        sedation options and risks were discussed with the                        patient. All questions were answered and informed                        consent was obtained.                       - Patient identification and proposed procedure were                        verified prior to the procedure by the nurse. The                        procedure was verified in the procedure room.                       - ASA Grade Assessment: III - A patient with severe                        systemic disease.                       - After reviewing the risks and benefits, the patient                        was deemed in satisfactory condition to undergo the                        procedure.                       After obtaining informed consent, the colonoscope was                        passed under direct vision. Throughout the procedure,                        the patient's blood pressure, pulse, and oxygen  saturations were monitored continuously. The                        Colonoscope was introduced through the anus and                        advanced to the the cecum, identified by appendiceal                        orifice and ileocecal valve. The colonoscopy was                  performed without difficulty. The patient tolerated the                        procedure well. The quality of the bowel preparation                        was good. The ileocecal valve, appendiceal orifice, and                        rectum were photographed. Findings:      The perianal and digital rectal examinations were normal. Pertinent       negatives include normal sphincter tone and no palpable rectal lesions.      Many small-mouthed diverticula were found in the colon.      Non-bleeding internal hemorrhoids were found during retroflexion. The       hemorrhoids were Grade I (internal hemorrhoids that do not prolapse).      The exam was otherwise without abnormality. Impression:           - Diverticulosis.                       - Non-bleeding internal hemorrhoids.                       - The examination was otherwise normal.                       - No specimens collected. Recommendation:       - Patient has a contact number available for                        emergencies. The signs and symptoms of potential                        delayed complications were discussed with the patient.                        Return to normal activities tomorrow. Written discharge                        instructions were provided to the patient.                       - Resume previous diet.                       - Continue present medications.                       - Repeat colonoscopy in 10 years for  screening purposes.                       - Return to GI office PRN. Procedure Code(s):    --- Professional ---                       K9179, Colorectal cancer screening; colonoscopy on                        individual not meeting criteria for high risk Diagnosis Code(s):    --- Professional ---                       K57.30, Diverticulosis of large intestine without                        perforation or abscess without bleeding                       K64.0, First degree hemorrhoids                        Z12.11, Encounter for screening for malignant neoplasm                        of colon CPT copyright 2018 American Medical Association. All rights reserved. The codes documented in this report are preliminary and upon coder review may  be revised to meet current compliance requirements. Efrain Sella MD, MD 04/29/2018 9:17:00 AM This report has been signed electronically. Number of Addenda: 0 Note Initiated On: 04/29/2018 8:50 AM Scope Withdrawal Time: 0 hours 5 minutes 27 seconds  Total Procedure Duration: 0 hours 9 minutes 36 seconds       [facility]

## 2018-04-29 NOTE — Anesthesia Post-op Follow-up Note (Signed)
Anesthesia QCDR form completed.        

## 2018-04-29 NOTE — Anesthesia Preprocedure Evaluation (Signed)
Anesthesia Evaluation  Patient identified by MRN, date of birth, ID band Patient awake    Reviewed: Allergy & Precautions, H&P , NPO status , Patient's Chart, lab work & pertinent test results  History of Anesthesia Complications Negative for: history of anesthetic complications  Airway Mallampati: III  TM Distance: >3 FB Neck ROM: limited    Dental  (+) Chipped, Poor Dentition, Missing   Pulmonary neg shortness of breath, COPD, Current Smoker,           Cardiovascular Exercise Tolerance: Good hypertension, (-) angina(-) Past MI and (-) DOE      Neuro/Psych negative neurological ROS  negative psych ROS   GI/Hepatic Neg liver ROS, GERD  Medicated and Controlled,  Endo/Other  diabetes, Type 2, Oral Hypoglycemic Agents  Renal/GU negative Renal ROS  negative genitourinary   Musculoskeletal  (+) Arthritis ,   Abdominal   Peds  Hematology negative hematology ROS (+)   Anesthesia Other Findings Past Medical History: No date: Diabetes mellitus, type 2 ([facility]) No date: Hyperlipidemia No date: Hypertension No date: Osteoarthritis of both shoulders  Past Surgical History: 05/17/2016: BREAST BIOPSY; Left     Comment:  neg 8/08: FOOT FRACTURE SURGERY; Right No date: FRACTURE SURGERY No date: NO PAST SURGERIES  BMI    Body Mass Index:  25.50 kg/m      Reproductive/Obstetrics negative OB ROS                             Anesthesia Physical Anesthesia Plan  ASA: III  Anesthesia Plan: General   Post-op Pain Management:    Induction: Intravenous  PONV Risk Score and Plan: Propofol infusion and TIVA  Airway Management Planned: Natural Airway and Nasal Cannula  Additional Equipment:   Intra-op Plan:   Post-operative Plan:   Informed Consent: I have reviewed the patients History and Physical, chart, labs and discussed the procedure including the risks, benefits and alternatives for  the proposed anesthesia with the patient or authorized representative who has indicated his/her understanding and acceptance.     Dental Advisory Given  Plan Discussed with: Anesthesiologist, CRNA and Surgeon  Anesthesia Plan Comments: (Patient consented for risks of anesthesia including but not limited to:  - adverse reactions to medications - risk of intubation if required - damage to teeth, lips or other oral mucosa - sore throat or hoarseness - Damage to heart, brain, lungs or loss of life  Patient voiced understanding.)        Anesthesia Quick Evaluation

## 2018-04-29 NOTE — Interval H&P Note (Signed)
History and Physical Interval Note:  04/29/2018 8:51 AM  Monica Harrington  has presented today for surgery, with the diagnosis of Colon Cancer Screening  The various methods of treatment have been discussed with the patient and family. After consideration of risks, benefits and other options for treatment, the patient has consented to  Procedure(s): COLONOSCOPY WITH PROPOFOL (N/A) as a surgical intervention .  The patient's history has been reviewed, patient examined, no change in status, stable for surgery.  I have reviewed the patient's chart and labs.  Questions were answered to the patient's satisfaction.     [place], [place]

## 2018-04-29 NOTE — Anesthesia Postprocedure Evaluation (Signed)
Anesthesia Post Note  Patient: Monica Harrington  Procedure(s) Performed: COLONOSCOPY WITH PROPOFOL (N/A )  Patient location during evaluation: Endoscopy Anesthesia Type: General Level of consciousness: awake and alert Pain management: pain level controlled Vital Signs Assessment: post-procedure vital signs reviewed and stable Respiratory status: spontaneous breathing, nonlabored ventilation, respiratory function stable and patient connected to nasal cannula oxygen Cardiovascular status: blood pressure returned to baseline and stable Postop Assessment: no apparent nausea or vomiting Anesthetic complications: no     Last Vitals:  Vitals:   04/29/18 0930 04/29/18 0940  BP: 102/70 124/78  Pulse: 66 62  Resp: 13 12  Temp:    SpO2: 100% 98%    Last Pain:  Vitals:   04/29/18 0910  TempSrc: Tympanic  PainSc:                  Precious Haws [place]

## 2018-04-29 NOTE — H&P (Signed)
Outpatient short stay form Pre-procedure 04/29/2018 8:50 AM Monica Harrington [name], M.D.  Primary Physician: Viviana Simpler, M.D.  Reason for visit:  Colon cancer screening.  History of present illness: Patient presents for colonoscopy for colon cancer screening. The patient denies complaints of abdominal pain, significant change in bowel habits, or rectal bleeding.     Current Facility-Administered Medications:  .  0.9 %  sodium chloride infusion, , Intravenous, Continuous, [place], Benay Pike, MD, Last Rate: 20 mL/hr at 04/29/18 0841, 1,000 mL at 04/29/18 0841  Medications Prior to Admission  Medication Sig Dispense Refill Last Dose  . atorvastatin (LIPITOR) 20 MG tablet TAKE 1 TABLET BY MOUTH EVERY DAY 90 tablet 3 04/28/2018 at Unknown time  . glucose blood (ONETOUCH VERIO) test strip Use to check blood sugar once a day Dx Code E11.9 100 each 3 04/28/2018 at Unknown time  . losartan-hydrochlorothiazide (HYZAAR) 50-12.5 MG tablet TAKE 1 TABLET BY MOUTH EVERY DAY 90 tablet 3 04/29/2018 at 0530  . metFORMIN (GLUCOPHAGE) 1000 MG tablet TAKE 1 TABLET (1,000 MG TOTAL) BY MOUTH 2 (TWO) TIMES DAILY WITH A MEAL. 180 tablet 3 04/28/2018 at Unknown time  . Multiple Vitamins-Minerals (CENTRUM SILVER ULTRA WOMENS PO) Take 1 tablet by mouth daily.   04/28/2018 at Unknown time  . ONETOUCH DELICA LANCETS 09O MISC CHECK BLOOD SUGAR TWICE DAILY AND AS INSTRUCTED. DX E11.9 100 each 7 04/28/2018 at Unknown time  . pantoprazole (PROTONIX) 40 MG tablet Take 1 tablet (40 mg total) by mouth daily. 90 tablet 3 04/28/2018 at Unknown time     No Known Allergies   Past Medical History:  Diagnosis Date  . Diabetes mellitus, type 2 ([place])   . Hyperlipidemia   . Hypertension   . Osteoarthritis of both shoulders     Review of systems:  Otherwise negative.    Physical Exam  Gen: Alert, oriented. Appears stated age.  HEENT: [place]/AT. PERRLA. Lungs: CTA, no wheezes. CV: RR nl S1, S2. Abd: soft, benign, no masses.  BS+ Ext: No edema. Pulses 2+    Planned procedures: Proceed with colonoscopy. The patient understands the nature of the planned procedure, indications, risks, alternatives and potential complications including but not limited to bleeding, infection, perforation, damage to internal organs and possible oversedation/side effects from anesthesia. The patient agrees and gives consent to proceed.  Please refer to procedure notes for findings, recommendations and patient disposition/instructions.     [name] [name], M.D. Gastroenterology 04/29/2018  8:50 AM

## 2018-04-29 NOTE — Transfer of Care (Signed)
Immediate Anesthesia Transfer of Care Note  Patient: Monica Harrington  Procedure(s) Performed: COLONOSCOPY WITH PROPOFOL (N/A )  Patient Location: Endoscopy Unit  Anesthesia Type:General  Level of Consciousness: awake, alert  and oriented  Airway & Oxygen Therapy: Patient Spontanous Breathing and Patient connected to nasal cannula oxygen  Post-op Assessment: Report given to RN and Post -op Vital signs reviewed and stable  Post vital signs: Reviewed and stable  Last Vitals:  Vitals Value Taken Time  BP    Temp    Pulse    Resp    SpO2      Last Pain:  Vitals:   04/29/18 0826  TempSrc: Tympanic  PainSc: 0-No pain         Complications: No apparent anesthesia complications

## 2018-04-30 ENCOUNTER — Encounter: Payer: Self-pay | Admitting: Internal Medicine

## 2018-06-01 ENCOUNTER — Other Ambulatory Visit: Payer: Self-pay | Admitting: Internal Medicine

## 2018-08-06 ENCOUNTER — Telehealth (INDEPENDENT_AMBULATORY_CARE_PROVIDER_SITE_OTHER): Payer: Medicare Other

## 2018-08-06 ENCOUNTER — Other Ambulatory Visit: Payer: Self-pay | Admitting: Internal Medicine

## 2018-08-06 DIAGNOSIS — E119 Type 2 diabetes mellitus without complications: Secondary | ICD-10-CM | POA: Diagnosis not present

## 2018-08-06 NOTE — Telephone Encounter (Signed)
Yes, I placed the order. Thanks

## 2018-08-06 NOTE — Telephone Encounter (Signed)
-----   Message from Camillia Herter sent at 08/06/2018  9:06 AM EDT ----- Regarding: RE: Visit 08-11-18 Patient's coming for her lab work tomorrow morning.  Are the orders in the computer? ----- Message ----- From: Pilar Grammes, CMA Sent: 08/06/2018   8:10 AM EDT To: Camillia Herter Subject: FW: Visit 08-11-18                                ----- Message ----- From: Venia Carbon, MD Sent: 08/06/2018   8:02 AM EDT To: Pilar Grammes, CMA Subject: RE: Visit 08-11-18                               Yes Set up the A1c before the visit ----- Message ----- From: Pilar Grammes, CMA Sent: 08/05/2018   2:31 PM EDT To: Venia Carbon, MD Subject: Visit 08-11-18                                   Pt is scheduled for 6 month f/u on Monday 5-4. She does not have video capabilities. Do you want to do by phone? She has [facility] Medicare. If so, she probably needs labs Thursday or Friday and will need orders.

## 2018-08-06 NOTE — Addendum Note (Signed)
Addended by: Cloyd Stagers on: 08/06/2018 02:30 PM   Modules accepted: Orders

## 2018-08-07 ENCOUNTER — Other Ambulatory Visit: Payer: Medicare Other

## 2018-08-07 ENCOUNTER — Other Ambulatory Visit: Payer: Self-pay

## 2018-08-07 DIAGNOSIS — E119 Type 2 diabetes mellitus without complications: Secondary | ICD-10-CM

## 2018-08-07 LAB — POCT GLYCOSYLATED HEMOGLOBIN (HGB A1C): Hemoglobin A1C: 6.4 % — AB (ref 4.0–5.6)

## 2018-08-11 ENCOUNTER — Ambulatory Visit (INDEPENDENT_AMBULATORY_CARE_PROVIDER_SITE_OTHER): Payer: Medicare Other | Admitting: Internal Medicine

## 2018-08-11 ENCOUNTER — Encounter: Payer: Self-pay | Admitting: Internal Medicine

## 2018-08-11 VITALS — Ht 65.5 in | Wt 165.0 lb

## 2018-08-11 DIAGNOSIS — M19042 Primary osteoarthritis, left hand: Secondary | ICD-10-CM

## 2018-08-11 DIAGNOSIS — F17210 Nicotine dependence, cigarettes, uncomplicated: Secondary | ICD-10-CM

## 2018-08-11 DIAGNOSIS — K219 Gastro-esophageal reflux disease without esophagitis: Secondary | ICD-10-CM

## 2018-08-11 DIAGNOSIS — M19041 Primary osteoarthritis, right hand: Secondary | ICD-10-CM | POA: Diagnosis not present

## 2018-08-11 DIAGNOSIS — I10 Essential (primary) hypertension: Secondary | ICD-10-CM

## 2018-08-11 DIAGNOSIS — E119 Type 2 diabetes mellitus without complications: Secondary | ICD-10-CM

## 2018-08-11 NOTE — Assessment & Plan Note (Signed)
Lab Results  Component Value Date   HGBA1C 6.4 (A) 08/07/2018   Still has good control on just the metformin Mild loose stools but not troubling No obvious complications

## 2018-08-11 NOTE — Assessment & Plan Note (Signed)
Controlled with daily PPI

## 2018-08-11 NOTE — Assessment & Plan Note (Signed)
Doing okay with just tylenol

## 2018-08-11 NOTE — Assessment & Plan Note (Signed)
Smokes rarely now Discussed giving them up completely

## 2018-08-11 NOTE — Progress Notes (Signed)
Subjective:    Patient ID: Monica Harrington, female    DOB: Sep 03, 1945, 73 y.o.   MRN: [id number]  HPI Phone visit for follow up of diabetes and other chronic health conditions Interactive audio and video telecommunications were attempted between this provider and patient, however failed, due to patient having technical difficulties OR patient did not have access to video capability.  We continued and completed visit with audio only.    Virtual Visit via Telephone Note  I connected with Monica Harrington on 08/11/18 at 10:30 AM EDT by telephone and verified that I am speaking with the correct person using two identifiers.  Location: Patient: Home Provider: offcie   I discussed the limitations, risks, security and privacy concerns of performing an evaluation and management service by telephone and the availability of in person appointments. I also discussed with the patient that there may be a patient responsible charge related to this service. The patient expressed understanding and agreed to proceed.   History of Present Illness: She feels fine Still checks every other day Today 96. Usually 80's- 100's No hypoglycemic symptoms Checks feet daily---no pain, sores, numbness, tingling  Still with some hand pain---thinks it is arthritis Uses arthritis tylenol with some help No limitations ----does all ADLs  Heartburn is controlled Taking the PPI daily  Gets out some--walks with niece No chest pain No SOB No edema No dizziness or syncope  Current Outpatient Medications on File Prior to Visit  Medication Sig Dispense Refill  . atorvastatin (LIPITOR) 20 MG tablet TAKE 1 TABLET BY MOUTH EVERY DAY 90 tablet 3  . glucose blood (ONETOUCH VERIO) test strip Use to check blood sugar once a day Dx Code E11.9 100 each 3  . losartan-hydrochlorothiazide (HYZAAR) 50-12.5 MG tablet TAKE 1 TABLET BY MOUTH EVERY DAY 90 tablet 3  . metFORMIN (GLUCOPHAGE) 1000 MG tablet TAKE 1 TABLET (1,000 MG  TOTAL) BY MOUTH 2 (TWO) TIMES DAILY WITH A MEAL. 180 tablet 3  . Multiple Vitamins-Minerals (CENTRUM SILVER ULTRA WOMENS PO) Take 1 tablet by mouth daily.    [name] DELICA LANCETS 79K MISC CHECK BLOOD SUGAR TWICE DAILY AND AS INSTRUCTED. DX E11.9 100 each 7  . pantoprazole (PROTONIX) 40 MG tablet Take 1 tablet (40 mg total) by mouth daily. 90 tablet 3   No current facility-administered medications on file prior to visit.     No Known Allergies  Past Medical History:  Diagnosis Date  . Diabetes mellitus, type 2 ([place])   . Hyperlipidemia   . Hypertension   . Osteoarthritis of both shoulders     Past Surgical History:  Procedure Laterality Date  . BREAST BIOPSY Left 05/17/2016   neg  . COLONOSCOPY WITH PROPOFOL N/A 04/29/2018   Procedure: COLONOSCOPY WITH PROPOFOL;  Surgeon: Toledo, Benay Pike, MD;  Location: [facility] ENDOSCOPY;  Service: Gastroenterology;  Laterality: N/A;  . FOOT FRACTURE SURGERY Right 8/08  . FRACTURE SURGERY    . NO PAST SURGERIES      Family History  Problem Relation Age of Onset  . Alcohol abuse Father   . Hypertension Sister   . Hypertension Sister   . Cancer Neg Hx   . Diabetes Neg Hx   . Heart disease Neg Hx     Social History   Socioeconomic History  . Marital status: Widowed    Spouse name: Not on file  . Number of children: Not on file  . Years of education: Not on file  . Highest education level:  Not on file  Occupational History  . 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 Financial resource strain: Not on file  . Food insecurity:    Worry: Not on file    Inability: Not on file  . Transportation needs:    Medical: Not on file    Non-medical: Not on file  Tobacco Use  . Smoking status: Current Some Day Smoker    Packs/day: 0.00    Years: 15.00    Pack years: 0.00    Types: Cigarettes  . Smokeless tobacco: Never Used  . Tobacco comment: none in the past week  Substance and Sexual  Activity  . Alcohol use: Yes    Alcohol/week: 2.0 standard drinks    Types: 2 Cans of beer per week    Comment: none in past month  . Drug use: Never  . Sexual activity: Not on file  Lifestyle  . Physical activity:    Days per week: Not on file    Minutes per session: Not on file  . Stress: Not on file  Relationships  . Social connections:    Talks on phone: Not on file    Gets together: Not on file    Attends religious service: Not on file    Active member of club or organization: Not on file    Attends meetings of clubs or organizations: Not on file    Relationship status: Not on file  . Intimate partner violence:    Fear of current or ex partner: Not on file    Emotionally abused: Not on file    Physically abused: Not on file    Forced sexual activity: Not on file  Other Topics Concern  . Not on file  Social History Narrative   Widowed ~2014      No living will   Would want sister to make health care decisions--- [name]   Would accept resuscitation--but no prolonged life support   Not sure about tube feeds--but wouldn't want if cognitively unaware   ROS Appetite is good Weight is fairly stable Sleeps fine Bowels are good--- sometimes 3-4 times a day and loose. Expects it is from the metformin No skin problems Only smokes occasionally---once a week or so   Observations/Objective: Upbeat and mood is good No breathing problems  Assessment and Plan: See problem list  Follow Up Instructions:    I discussed the assessment and treatment plan with the patient. The patient was provided an opportunity to ask questions and all were answered. The patient agreed with the plan and demonstrated an understanding of the instructions.   The patient was advised to call back or seek an in-person evaluation if the symptoms worsen or if the condition fails to improve as anticipated.  I provided 16 minutes of non-face-to-face time during this encounter.   Viviana Simpler, MD     Review of Systems     Objective:   Physical Exam         Assessment & Plan:

## 2018-08-11 NOTE — Assessment & Plan Note (Signed)
BP Readings from Last 3 Encounters:  04/29/18 124/78  02/10/18 124/66  08/20/17 120/64   She doesn't monitor BP but has generally been fine Continues to walk with niece Continue medication

## 2018-08-26 ENCOUNTER — Other Ambulatory Visit: Payer: Self-pay

## 2018-08-26 ENCOUNTER — Other Ambulatory Visit: Payer: Self-pay | Admitting: Internal Medicine

## 2018-08-26 NOTE — Patient Outreach (Signed)
[facility] [facility]) Care Management  08/26/2018  Monica Harrington [birth year] [id number]   Medication Adherence call to Monica Harrington Compliant Voice message left with a call back number. Monica Harrington is showing past due on Atorvastatin 20 mg and Losartan/Hctz 50/12.5 mg under [place].  [place] Management Direct Dial [phone]  Fax [phone] [email or web address]

## 2018-10-17 IMAGING — MG MM DIGITAL SCREENING BILAT W/ TOMO W/ CAD
9 of 14 series · 9 of 30 positions shown · non-contrast
Comparison: Previous exam(s).

CLINICAL DATA: Screening.

EXAM:
2D DIGITAL SCREENING BILATERAL MAMMOGRAM WITH CAD AND ADJUNCT TOMO

[R CC (1 of 2)]
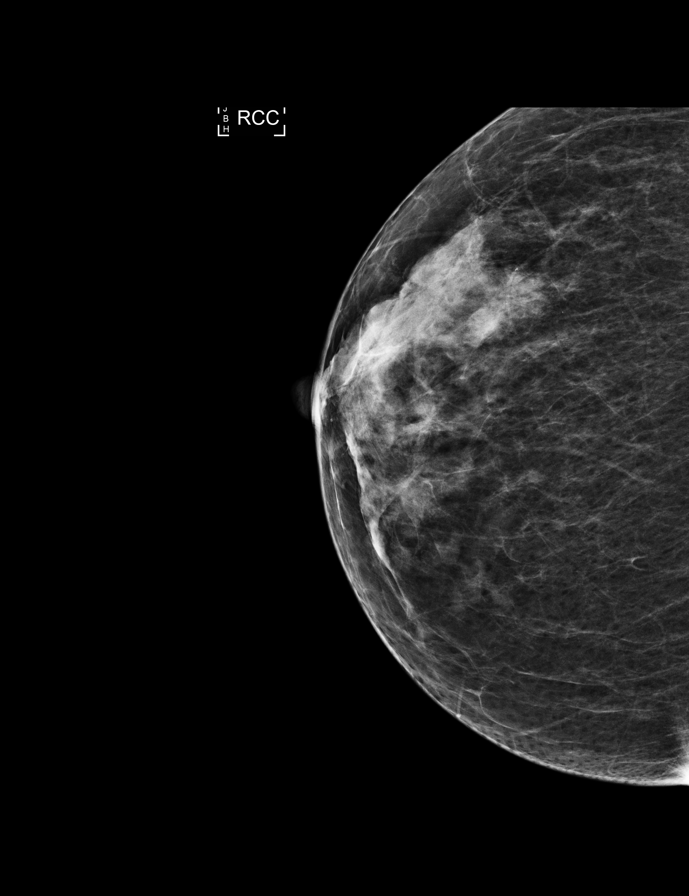

[L MLO]
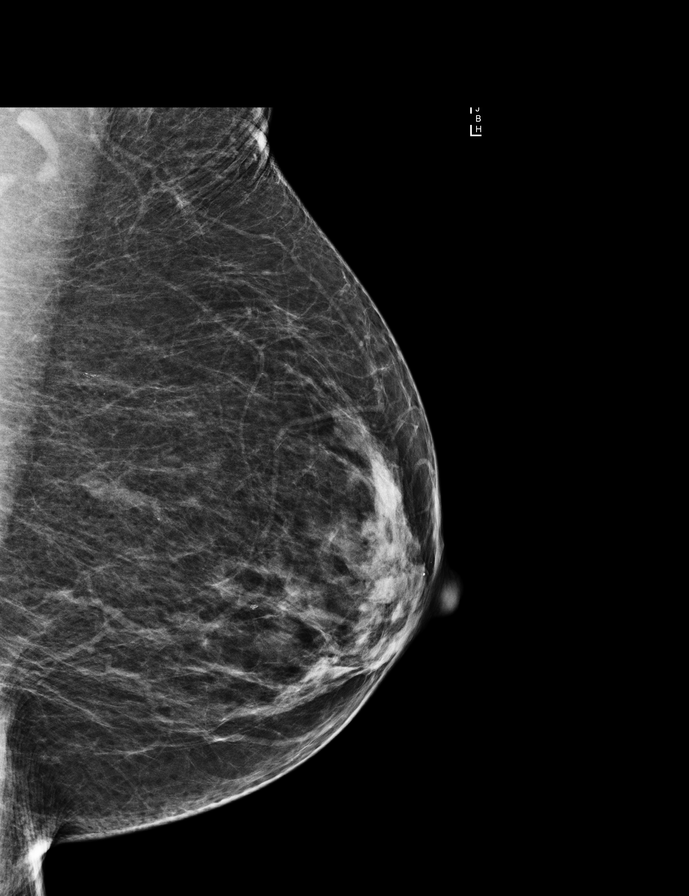

[L CC synth-2D]
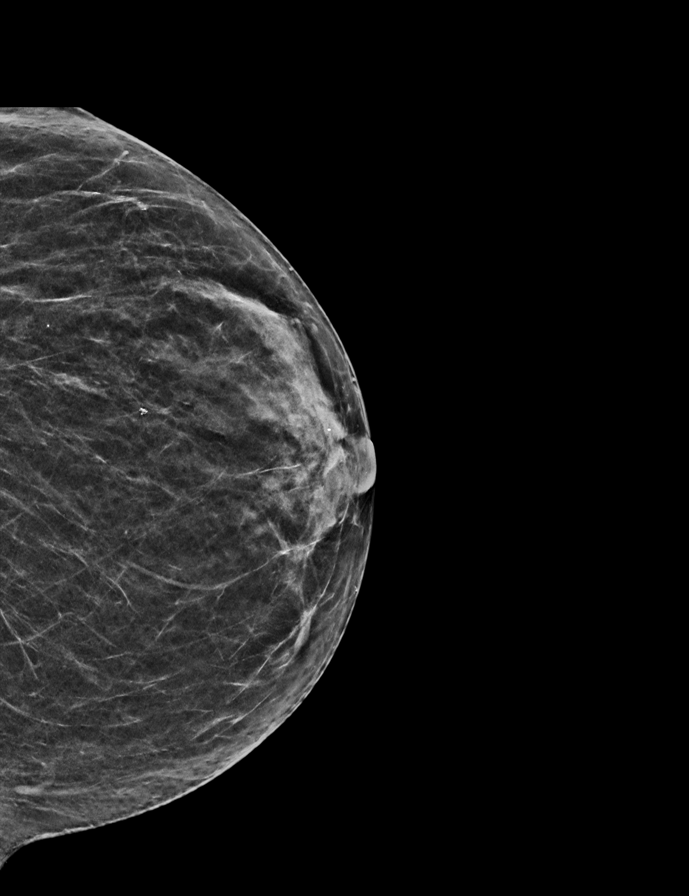

[L CC]
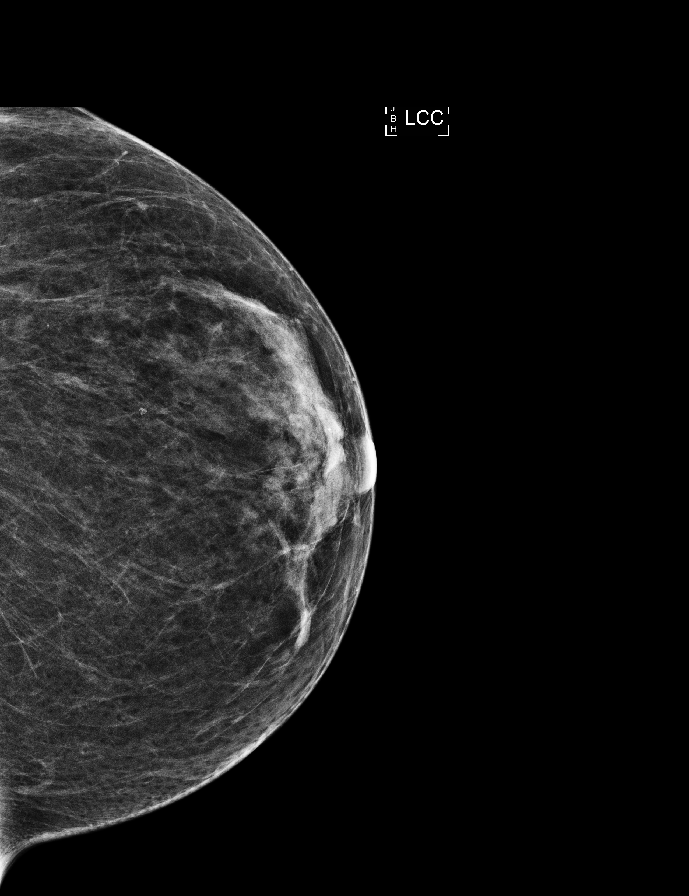

[L MLO synth-2D]
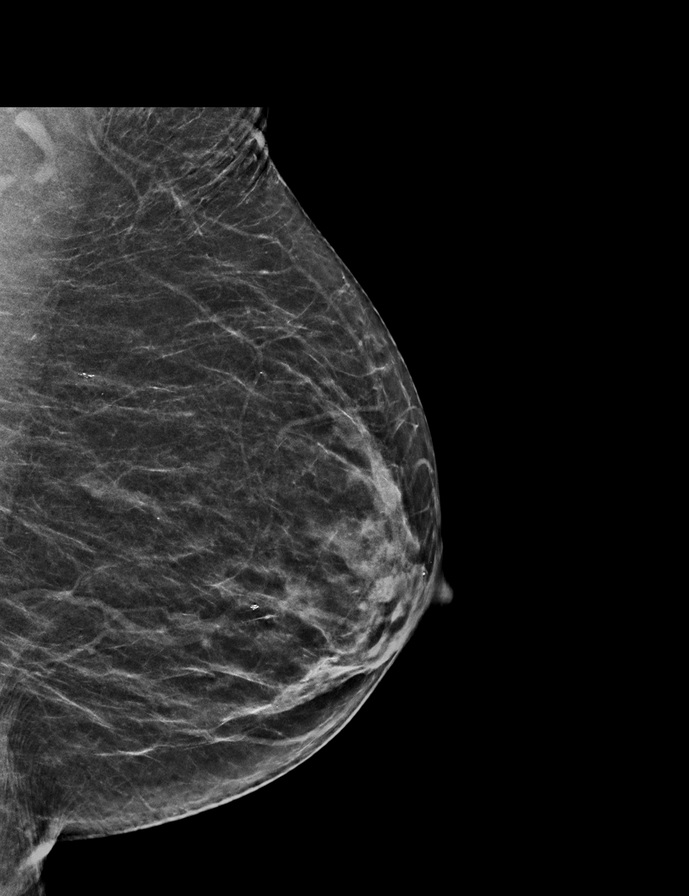

[R MLO synth-2D]
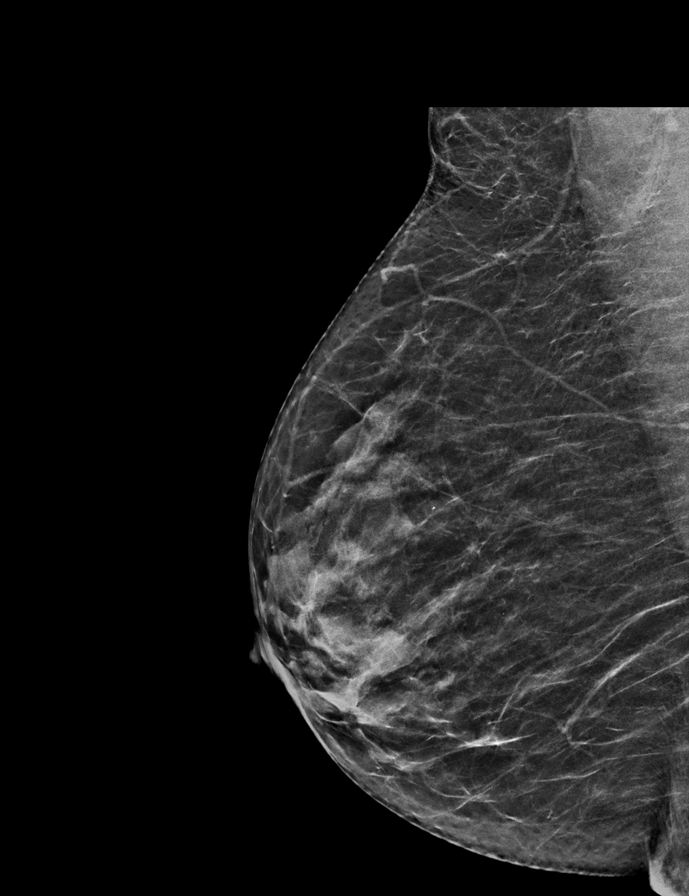

[R CC (2 of 2)]
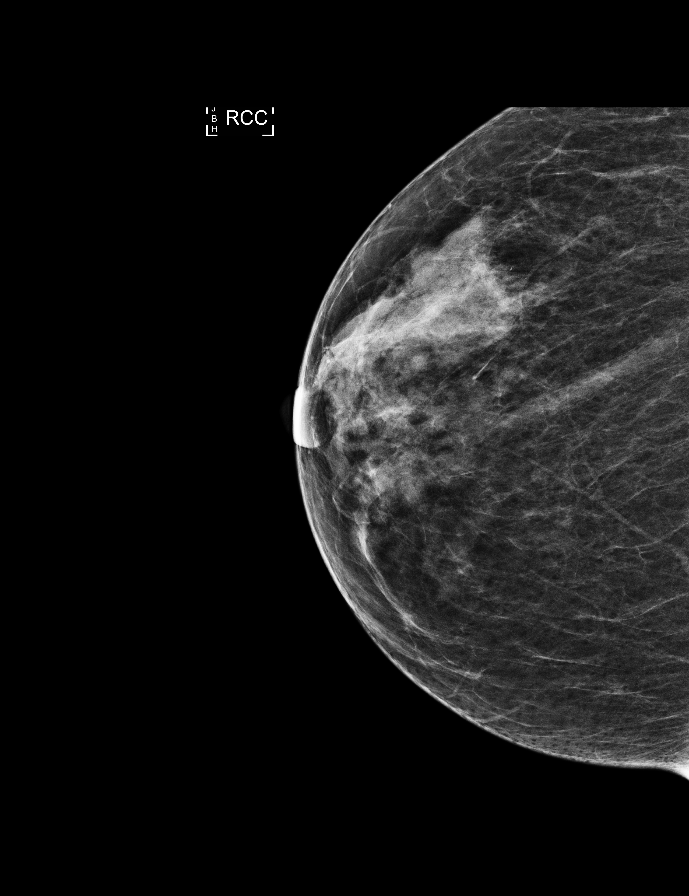

[R MLO]
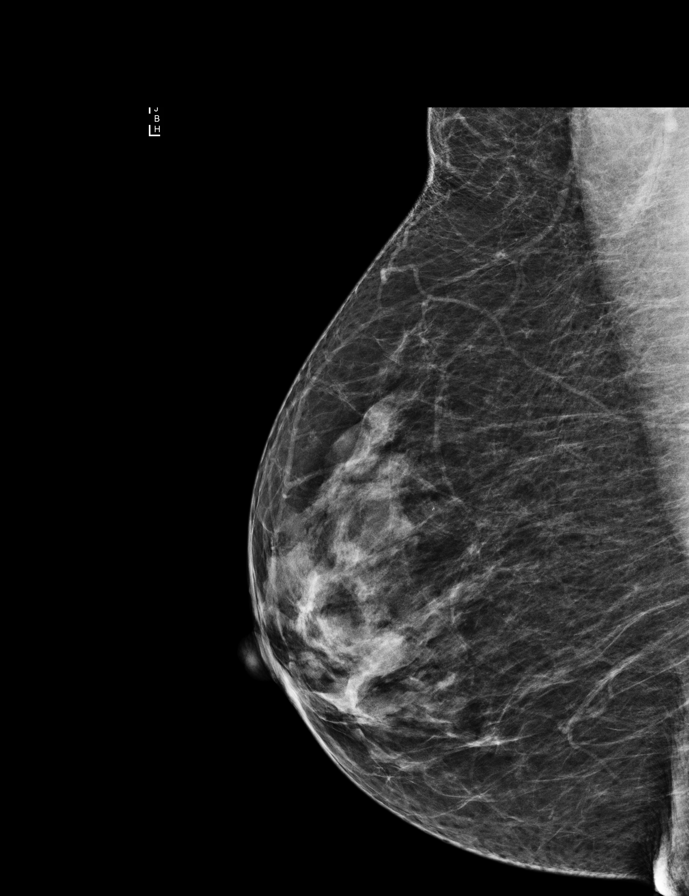

[R CC synth-2D]
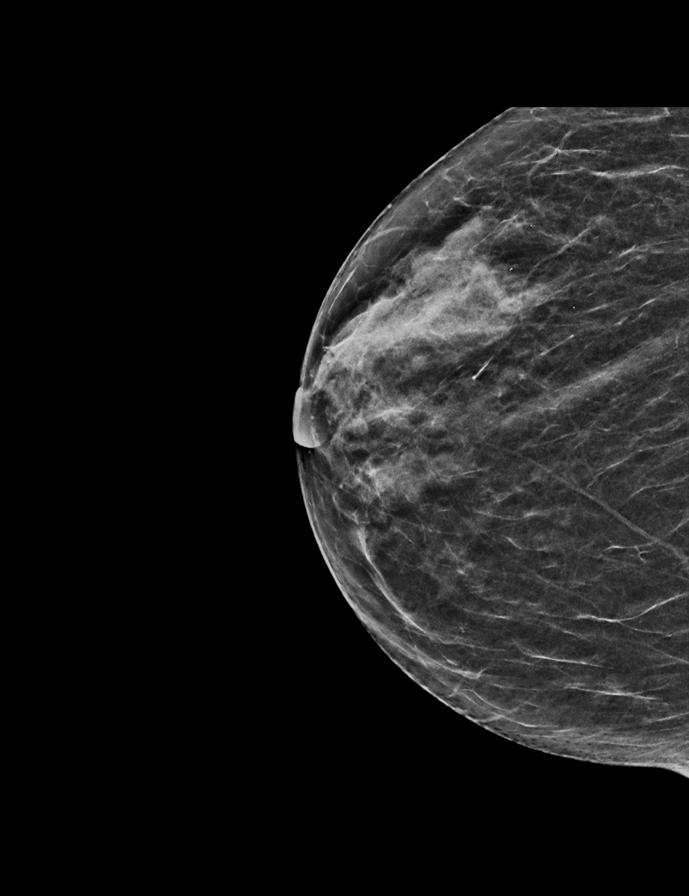

[9 of 30 positions shown; findings below may reference images not displayed]

ACR Breast Density Category b: There are scattered areas of
fibroglandular density.
FINDINGS: In the left breast, calcifications warrant further evaluation. In
the right breast, no findings suspicious for malignancy. Images were
processed with CAD.
IMPRESSION: Further evaluation is suggested for calcifications in the left
breast.

RECOMMENDATION:
Diagnostic mammogram of the left breast. (Code:[id number])

The patient will be contacted regarding the findings, and additional
imaging will be scheduled.

BI-RADS CATEGORY  0: Incomplete. Need additional imaging evaluation
and/or prior mammograms for comparison.

## 2018-11-08 ENCOUNTER — Other Ambulatory Visit: Payer: Self-pay | Admitting: Internal Medicine

## 2019-02-13 ENCOUNTER — Other Ambulatory Visit: Payer: Self-pay

## 2019-02-13 ENCOUNTER — Encounter: Payer: Self-pay | Admitting: Internal Medicine

## 2019-02-13 ENCOUNTER — Ambulatory Visit (INDEPENDENT_AMBULATORY_CARE_PROVIDER_SITE_OTHER): Payer: Medicare Other | Admitting: Internal Medicine

## 2019-02-13 VITALS — BP 118/62 | HR 70 | Temp 97.4°F | Ht 65.25 in | Wt 173.0 lb

## 2019-02-13 DIAGNOSIS — E1159 Type 2 diabetes mellitus with other circulatory complications: Secondary | ICD-10-CM | POA: Diagnosis not present

## 2019-02-13 DIAGNOSIS — Z7189 Other specified counseling: Secondary | ICD-10-CM

## 2019-02-13 DIAGNOSIS — I10 Essential (primary) hypertension: Secondary | ICD-10-CM

## 2019-02-13 DIAGNOSIS — H9113 Presbycusis, bilateral: Secondary | ICD-10-CM

## 2019-02-13 DIAGNOSIS — Z23 Encounter for immunization: Secondary | ICD-10-CM | POA: Diagnosis not present

## 2019-02-13 DIAGNOSIS — Z0000 Encounter for general adult medical examination without abnormal findings: Secondary | ICD-10-CM | POA: Diagnosis not present

## 2019-02-13 DIAGNOSIS — E785 Hyperlipidemia, unspecified: Secondary | ICD-10-CM

## 2019-02-13 LAB — CBC
HCT: 39.9 % (ref 36.0–46.0)
Hemoglobin: 12.9 g/dL (ref 12.0–15.0)
MCHC: 32.4 g/dL (ref 30.0–36.0)
MCV: 89.5 fl (ref 78.0–100.0)
Platelets: 375 10*3/uL (ref 150.0–400.0)
RBC: 4.46 Mil/uL (ref 3.87–5.11)
RDW: 14.4 % (ref 11.5–15.5)
WBC: 10.7 10*3/uL — ABNORMAL HIGH (ref 4.0–10.5)

## 2019-02-13 LAB — COMPREHENSIVE METABOLIC PANEL
ALT: 25 U/L (ref 0–35)
AST: 25 U/L (ref 0–37)
Albumin: 4.6 g/dL (ref 3.5–5.2)
Alkaline Phosphatase: 61 U/L (ref 39–117)
BUN: 16 mg/dL (ref 6–23)
CO2: 26 mEq/L (ref 19–32)
Calcium: 9.8 mg/dL (ref 8.4–10.5)
Chloride: 103 mEq/L (ref 96–112)
Creatinine, Ser: 0.86 mg/dL (ref 0.40–1.20)
GFR: 78.6 mL/min (ref >60.00)
Glucose, Bld: 95 mg/dL (ref 70–99)
Potassium: 4 mEq/L (ref 3.5–5.1)
Sodium: 139 mEq/L (ref 135–145)
Total Bilirubin: 0.3 mg/dL (ref 0.2–1.2)
Total Protein: 8.1 g/dL (ref 6.0–8.3)

## 2019-02-13 LAB — LIPID PANEL
Cholesterol: 146 mg/dL (ref 0–200)
HDL: 42.8 mg/dL (ref >39.00)
LDL Cholesterol: 83 mg/dL (ref 0–99)
NonHDL: 103.32
Total CHOL/HDL Ratio: 3
Triglycerides: 104 mg/dL (ref 0.0–149.0)
VLDL: 20.8 mg/dL (ref 0.0–40.0)

## 2019-02-13 LAB — HEMOGLOBIN A1C: Hgb A1c MFr Bld: 7 % — ABNORMAL HIGH (ref 4.6–6.5)

## 2019-02-13 LAB — HM DIABETES FOOT EXAM

## 2019-02-13 NOTE — Assessment & Plan Note (Signed)
I have personally reviewed the Medicare Annual Wellness questionnaire and have noted 1. The patient's medical and social history 2. Their use of alcohol, tobacco or illicit drugs 3. Their current medications and supplements 4. The patient's functional ability including ADL's, fall risks, home safety risks and hearing or visual             impairment. 5. Diet and physical activities 6. Evidence for depression or mood disorders  The patients weight, height, BMI and visual acuity have been recorded in the chart I have made referrals, counseling and provided education to the patient based review of the above and I have provided the pt with a written personalized care plan for preventive services.  I have provided you with a copy of your personalized plan for preventive services. Please take the time to review along with your updated medication list.  No pap due to age Just had normal colonoscopy (done with screening) Due for mammogram Flu vaccine today Consider shingrix Discussed exercise

## 2019-02-13 NOTE — Progress Notes (Signed)
Subjective:    Patient ID: Monica Harrington, female    DOB: 08/03/1945, 73 y.o.   MRN: [id number]  HPI Here for Medicare wellness visit and follow up of chronic health conditions Reviewed form and advanced directives Reviewed other doctors Did give up the smoking!! No alcohol Not really exercising --discussed No falls No depression or anhedonia Vision is fine Hearing not good--needs to get evaluated Independent with instrumental ADLs No sig memory problems  Doing well No problems with the COVID--just goes to grocery store Lives with sister and niece (since her husband passed) Hasn't been going out of her house much  Checks sugars every other day 80-100 generally Ongoing Rx for HTN No foot numbness, sores or pain Overdue for eye exam--is going to schedule  No chest pain No SOB No dizziness or syncope No edema No palpitations  No problems with statin No myalgias or GI problems  Current Outpatient Medications on File Prior to Visit  Medication Sig Dispense Refill  . atorvastatin (LIPITOR) 20 MG tablet TAKE 1 TABLET BY MOUTH EVERY DAY 90 tablet 3  . glucose blood (ONETOUCH VERIO) test strip Use to check blood sugar once a day Dx Code E11.9 100 each 3  . losartan-hydrochlorothiazide (HYZAAR) 50-12.5 MG tablet TAKE 1 TABLET BY MOUTH EVERY DAY 90 tablet 3  . metFORMIN (GLUCOPHAGE) 1000 MG tablet TAKE 1 TABLET (1,000 MG TOTAL) BY MOUTH 2 (TWO) TIMES DAILY WITH A MEAL. 180 tablet 3  . Multiple Vitamins-Minerals (CENTRUM SILVER ULTRA WOMENS PO) Take 1 tablet by mouth daily.    [name] DELICA LANCETS 99991111 MISC CHECK BLOOD SUGAR TWICE DAILY AND AS INSTRUCTED. DX E11.9 100 each 7  . pantoprazole (PROTONIX) 40 MG tablet Take 1 tablet (40 mg total) by mouth daily. 90 tablet 3   No current facility-administered medications on file prior to visit.     No Known Allergies  Past Medical History:  Diagnosis Date  . Diabetes mellitus, type 2 ([place])   . Hyperlipidemia   .  Hypertension   . Osteoarthritis of both shoulders     Past Surgical History:  Procedure Laterality Date  . BREAST BIOPSY Left 05/17/2016   neg  . COLONOSCOPY WITH PROPOFOL N/A 04/29/2018   Procedure: COLONOSCOPY WITH PROPOFOL;  Surgeon: Toledo, Benay Pike, MD;  Location: [facility] ENDOSCOPY;  Service: Gastroenterology;  Laterality: N/A;  . FOOT FRACTURE SURGERY Right 8/08  . FRACTURE SURGERY    . NO PAST SURGERIES      Family History  Problem Relation Age of Onset  . Alcohol abuse Father   . Hypertension Sister   . Hypertension Sister   . Cancer Neg Hx   . Diabetes Neg Hx   . Heart disease Neg Hx     Social History   Socioeconomic History  . Marital status: Widowed    Spouse name: Not on file  . Number of children: Not on file  . Years of education: Not on file  . Highest education level: Not on file  Occupational History  . 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 Financial resource strain: Not on file  . Food insecurity    Worry: Not on file    Inability: Not on file  . Transportation needs    Medical: Not on file    Non-medical: Not on file  Tobacco Use  . Smoking status: Former Smoker    Packs/day: 0.00    Years: 15.00  Pack years: 0.00    Types: Cigarettes    Quit date: 01/07/2019    Years since quitting: 0.1  . Smokeless tobacco: Never Used  Substance and Sexual Activity  . Alcohol use: Yes    Alcohol/week: 2.0 standard drinks    Types: 2 Cans of beer per week    Comment: none in past month  . Drug use: Never  . Sexual activity: Not on file  Lifestyle  . Physical activity    Days per week: Not on file    Minutes per session: Not on file  . Stress: Not on file  Relationships  . Social Herbalist on phone: Not on file    Gets together: Not on file    Attends religious service: Not on file    Active member of club or organization: Not on file    Attends meetings of clubs or organizations:  Not on file    Relationship status: Not on file  . Intimate partner violence    Fear of current or ex partner: Not on file    Emotionally abused: Not on file    Physically abused: Not on file    Forced sexual activity: Not on file  Other Topics Concern  . Not on file  Social History Narrative   Widowed ~2014      No living will   Would want sister to make health care decisions--- Monica Harrington   Would accept resuscitation--but no prolonged life support   Not sure about tube feeds--but wouldn't want if cognitively unaware   Review of Systems Rare headaches Appetite is good Weight up slightly (stopped smoking though) Sleeps well Wears seat belt Needs dental work---"they need pulling". Doesn't have dentist No rash or suspicious skin lesions No heartburn or dysphagia Bowels are fine. No blood No sig joint or back pain    Objective:   Physical Exam  Constitutional: She is oriented to person, place, and time. She appears well-developed. No distress.  HENT:  Mouth/Throat: Oropharynx is clear and moist. No oropharyngeal exudate.  Neck: No thyromegaly present.  Cardiovascular: Normal rate, regular rhythm, normal heart sounds and intact distal pulses. Exam reveals no gallop.  No murmur heard. Respiratory: Effort normal and breath sounds normal. No respiratory distress. She has no wheezes. She has no rales.  GI: Soft. There is no abdominal tenderness.  Musculoskeletal:        General: No tenderness or edema.  Lymphadenopathy:    She has no cervical adenopathy.  Neurological: She is alert and oriented to person, place, and time.  President--- "Daisy Floro, Obama, Bush" [phone] [id number] Recall 3/3  Fairly normal sensation in feet  Skin: No rash noted. No erythema.  No foot lesions  Psychiatric: She has a normal mood and affect. Her behavior is normal.           Assessment & Plan:

## 2019-02-13 NOTE — Assessment & Plan Note (Signed)
Considering hearing aides

## 2019-02-13 NOTE — Assessment & Plan Note (Signed)
Seems to still have very good control HTN as well Will check labs Due for eye exam

## 2019-02-13 NOTE — Patient Instructions (Signed)
Please set up your diabetic eye exam and screening mammogram.

## 2019-02-13 NOTE — Assessment & Plan Note (Signed)
See social history Blank forms given 

## 2019-02-13 NOTE — Progress Notes (Signed)
Hearing Screening (Inadequate exam)   Method: Audiometry   125Hz  250Hz  500Hz  1000Hz  2000Hz  3000Hz  4000Hz  6000Hz  8000Hz   Right ear:   0 0 0  0    Left ear:   0 0 0  0      Visual Acuity Screening   Right eye Left eye Both eyes  Without correction:     With correction: 20/40 20/30 20/25

## 2019-02-13 NOTE — Addendum Note (Signed)
Addended by: Pilar Grammes on: 02/13/2019 10:07 AM   Modules accepted: Orders

## 2019-02-13 NOTE — Assessment & Plan Note (Signed)
BP Readings from Last 3 Encounters:  02/13/19 118/62  04/29/18 124/78  02/10/18 124/66   Good control

## 2019-02-13 NOTE — Assessment & Plan Note (Signed)
No problems with statin 

## 2019-03-09 ENCOUNTER — Other Ambulatory Visit: Payer: Self-pay | Admitting: Internal Medicine

## 2019-08-14 ENCOUNTER — Other Ambulatory Visit: Payer: Self-pay

## 2019-08-14 ENCOUNTER — Other Ambulatory Visit: Payer: Self-pay | Admitting: Internal Medicine

## 2019-08-14 ENCOUNTER — Encounter: Payer: Self-pay | Admitting: Internal Medicine

## 2019-08-14 ENCOUNTER — Ambulatory Visit (INDEPENDENT_AMBULATORY_CARE_PROVIDER_SITE_OTHER): Payer: Medicare Other | Admitting: Internal Medicine

## 2019-08-14 VITALS — BP 126/70 | HR 78 | Temp 97.0°F | Ht 65.0 in | Wt 179.0 lb

## 2019-08-14 DIAGNOSIS — E1159 Type 2 diabetes mellitus with other circulatory complications: Secondary | ICD-10-CM | POA: Diagnosis not present

## 2019-08-14 DIAGNOSIS — K219 Gastro-esophageal reflux disease without esophagitis: Secondary | ICD-10-CM | POA: Diagnosis not present

## 2019-08-14 DIAGNOSIS — I10 Essential (primary) hypertension: Secondary | ICD-10-CM

## 2019-08-14 LAB — POCT GLYCOSYLATED HEMOGLOBIN (HGB A1C): Hemoglobin A1C: 6.7 % — AB (ref 4.0–5.6)

## 2019-08-14 NOTE — Patient Instructions (Signed)
Try not taking the pantoprazole 1-2 days a week. If you don't have acid problems, you can cut back to every other day.

## 2019-08-14 NOTE — Progress Notes (Signed)
Subjective:    Patient ID: Monica Harrington, female    DOB: April 24, 1945, 74 y.o.   MRN: [id number]  HPI Here for follow up of diabetes and other chronic health conditions This visit occurred during the SARS-CoV-2 public health emergency.  Safety protocols were in place, including screening questions prior to the visit, additional usage of staff PPE, and extensive cleaning of exam room while observing appropriate contact time as indicated for disinfecting solutions.   Doing well Checks sugars 3 days per week 98-120--depending on what she eats No foot pain, numbness, burning  No chest pain No SOB No recent exercise No edema No palpitations  Takes pantoprazole daily (empty stomach) Controls heartburn No dysphagia  Current Outpatient Medications on File Prior to Visit  Medication Sig Dispense Refill  . atorvastatin (LIPITOR) 20 MG tablet TAKE 1 TABLET BY MOUTH EVERY DAY 90 tablet 3  . glucose blood (ONETOUCH VERIO) test strip Use to check blood sugar once a day Dx Code E11.9 100 each 3  . losartan-hydrochlorothiazide (HYZAAR) 50-12.5 MG tablet TAKE 1 TABLET BY MOUTH EVERY DAY 90 tablet 3  . metFORMIN (GLUCOPHAGE) 1000 MG tablet TAKE 1 TABLET (1,000 MG TOTAL) BY MOUTH 2 (TWO) TIMES DAILY WITH A MEAL. 180 tablet 3  . Multiple Vitamins-Minerals (CENTRUM SILVER ULTRA WOMENS PO) Take 1 tablet by mouth daily.    [name] DELICA LANCETS 99991111 MISC CHECK BLOOD SUGAR TWICE DAILY AND AS INSTRUCTED. DX E11.9 100 each 7  . pantoprazole (PROTONIX) 40 MG tablet TAKE 1 TABLET BY MOUTH EVERY DAY 90 tablet 3   No current facility-administered medications on file prior to visit.    No Known Allergies  Past Medical History:  Diagnosis Date  . Diabetes mellitus, type 2 ([place])   . Hyperlipidemia   . Hypertension   . Osteoarthritis of both shoulders     Past Surgical History:  Procedure Laterality Date  . BREAST BIOPSY Left 05/17/2016   neg  . COLONOSCOPY WITH PROPOFOL N/A 04/29/2018   Procedure: COLONOSCOPY WITH PROPOFOL;  Surgeon: Toledo, Benay Pike, MD;  Location: [facility] ENDOSCOPY;  Service: Gastroenterology;  Laterality: N/A;  . FOOT FRACTURE SURGERY Right 8/08  . FRACTURE SURGERY    . NO PAST SURGERIES      Family History  Problem Relation Age of Onset  . Alcohol abuse Father   . Hypertension Sister   . Hypertension Sister   . Cancer Neg Hx   . Diabetes Neg Hx   . Heart disease Neg Hx     Social History   Socioeconomic History  . Marital status: Widowed    Spouse name: Not on file  . Number of children: Not on file  . Years of education: Not on file  . Highest education level: Not on file  Occupational History  . 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 Smoking status: Former Smoker    Packs/day: 0.00    Years: 15.00    Pack years: 0.00    Types: Cigarettes    Quit date: 01/07/2019    Years since quitting: 0.6  . Smokeless tobacco: Never Used  Substance and Sexual Activity  . Alcohol use: Yes    Alcohol/week: 2.0 standard drinks    Types: 2 Cans of beer per week    Comment: none in past month  . Drug use: Never  . Sexual activity: Not on file  Other Topics Concern  . Not on file  Social History Narrative   Widowed ~2014      No living will   Would want sister to make health care decisions--- [name]   Would accept resuscitation--but no prolonged life support   Not sure about tube feeds--but wouldn't want if cognitively unaware   Social Determinants of Health   Financial Resource Strain:   . Difficulty of Paying Living Expenses:   Food Insecurity:   . Worried About Charity fundraiser in the Last Year:   . Arboriculturist in the Last Year:   Transportation Needs:   . Film/video editor (Medical):   Marland Kitchen Lack of Transportation (Non-Medical):   Physical Activity:   . Days of Exercise per Week:   . Minutes of Exercise per Session:   Stress:   . Feeling of Stress :   Social  Connections:   . Frequency of Communication with Friends and Family:   . Frequency of Social Gatherings with Friends and Family:   . Attends Religious Services:   . Active Member of Clubs or Organizations:   . Attends Archivist Meetings:   Marland Kitchen Marital Status:   Intimate Partner Violence:   . Fear of Current or Ex-Partner:   . Emotionally Abused:   Marland Kitchen Physically Abused:   . Sexually Abused:    Review of Systems  Has gained a few pounds Did get first COVID vaccine--second due next week Plans mammogram and eye exam once this is done Sleeps well    Objective:   Physical Exam  Constitutional: She appears well-developed. No distress.  Neck: No thyromegaly present.  Large lipoma right posterior neck/back  Cardiovascular: Normal rate, regular rhythm, normal heart sounds and intact distal pulses. Exam reveals no gallop.  Respiratory: Effort normal and breath sounds normal. No respiratory distress. She has no wheezes. She has no rales.  Musculoskeletal:        General: No tenderness or edema.  Lymphadenopathy:    She has no cervical adenopathy.  Skin:  No foot lesions  Psychiatric: She has a normal mood and affect. Her behavior is normal.           Assessment & Plan:

## 2019-08-14 NOTE — Assessment & Plan Note (Signed)
BP Readings from Last 3 Encounters:  08/14/19 126/70  02/13/19 118/62  04/29/18 124/78   Good control on ARB/HCTZ

## 2019-08-14 NOTE — Assessment & Plan Note (Signed)
Controlled with the PPI Discussed holding doses to see if she can take less often

## 2019-08-14 NOTE — Assessment & Plan Note (Signed)
Continues to have good control Just metformin Has HTN as well  Lab Results  Component Value Date   HGBA1C 6.7 (A) 08/14/2019

## 2019-08-23 ENCOUNTER — Other Ambulatory Visit: Payer: Self-pay | Admitting: Internal Medicine

## 2019-11-08 ENCOUNTER — Other Ambulatory Visit: Payer: Self-pay | Admitting: Internal Medicine

## 2020-02-17 ENCOUNTER — Ambulatory Visit (INDEPENDENT_AMBULATORY_CARE_PROVIDER_SITE_OTHER): Payer: Medicare Other | Admitting: Internal Medicine

## 2020-02-17 ENCOUNTER — Other Ambulatory Visit: Payer: Self-pay

## 2020-02-17 ENCOUNTER — Encounter: Payer: Self-pay | Admitting: Internal Medicine

## 2020-02-17 VITALS — BP 138/76 | HR 69 | Temp 97.5°F | Ht 65.5 in | Wt 186.0 lb

## 2020-02-17 DIAGNOSIS — Z23 Encounter for immunization: Secondary | ICD-10-CM | POA: Diagnosis not present

## 2020-02-17 DIAGNOSIS — I10 Essential (primary) hypertension: Secondary | ICD-10-CM | POA: Diagnosis not present

## 2020-02-17 DIAGNOSIS — E785 Hyperlipidemia, unspecified: Secondary | ICD-10-CM

## 2020-02-17 DIAGNOSIS — E1159 Type 2 diabetes mellitus with other circulatory complications: Secondary | ICD-10-CM | POA: Diagnosis not present

## 2020-02-17 DIAGNOSIS — K219 Gastro-esophageal reflux disease without esophagitis: Secondary | ICD-10-CM

## 2020-02-17 DIAGNOSIS — Z7189 Other specified counseling: Secondary | ICD-10-CM

## 2020-02-17 DIAGNOSIS — Z0000 Encounter for general adult medical examination without abnormal findings: Secondary | ICD-10-CM

## 2020-02-17 LAB — CBC
HCT: 37.9 % (ref 36.0–46.0)
Hemoglobin: 12.2 g/dL (ref 12.0–15.0)
MCHC: 32.2 g/dL (ref 30.0–36.0)
MCV: 88.3 fl (ref 78.0–100.0)
Platelets: 359 10*3/uL (ref 150.0–400.0)
RBC: 4.29 Mil/uL (ref 3.87–5.11)
RDW: 15 % (ref 11.5–15.5)
WBC: 9.2 10*3/uL (ref 4.0–10.5)

## 2020-02-17 LAB — HEPATIC FUNCTION PANEL
ALT: 30 U/L (ref 0–35)
AST: 30 U/L (ref 0–37)
Albumin: 4.4 g/dL (ref 3.5–5.2)
Alkaline Phosphatase: 60 U/L (ref 39–117)
Bilirubin, Direct: 0 mg/dL (ref 0.0–0.3)
Total Bilirubin: 0.2 mg/dL (ref 0.2–1.2)
Total Protein: 7.7 g/dL (ref 6.0–8.3)

## 2020-02-17 LAB — LIPID PANEL
Cholesterol: 116 mg/dL (ref 0–200)
HDL: 41.1 mg/dL (ref >39.00)
LDL Cholesterol: 59 mg/dL (ref 0–99)
NonHDL: 75.08
Total CHOL/HDL Ratio: 3
Triglycerides: 82 mg/dL (ref 0.0–149.0)
VLDL: 16.4 mg/dL (ref 0.0–40.0)

## 2020-02-17 LAB — RENAL FUNCTION PANEL
Albumin: 4.4 g/dL (ref 3.5–5.2)
BUN: 14 mg/dL (ref 6–23)
CO2: 28 mEq/L (ref 19–32)
Calcium: 9.7 mg/dL (ref 8.4–10.5)
Chloride: 104 mEq/L (ref 96–112)
Creatinine, Ser: 0.86 mg/dL (ref 0.40–1.20)
GFR: 67.46 mL/min (ref >60.00)
Glucose, Bld: 138 mg/dL — ABNORMAL HIGH (ref 70–99)
Phosphorus: 3.7 mg/dL (ref 2.3–4.6)
Potassium: 3.8 mEq/L (ref 3.5–5.1)
Sodium: 138 mEq/L (ref 135–145)

## 2020-02-17 LAB — HM DIABETES FOOT EXAM

## 2020-02-17 LAB — HEMOGLOBIN A1C: Hgb A1c MFr Bld: 8.1 % — ABNORMAL HIGH (ref 4.6–6.5)

## 2020-02-17 NOTE — Assessment & Plan Note (Signed)
Still seems to have good control Will check A1c Is on statin BP is controlled

## 2020-02-17 NOTE — Progress Notes (Signed)
Subjective:    Patient ID: Monica Harrington, female    DOB: 07-31-1945, 74 y.o.   MRN: [id number]  HPI Here for Medicare wellness visit and follow up of chronic health conditions This visit occurred during the SARS-CoV-2 public health emergency.  Safety protocols were in place, including screening questions prior to the visit, additional usage of staff PPE, and extensive cleaning of exam room while observing appropriate contact time as indicated for disinfecting solutions.   Reviewed form and advanced directives Reviewed other doctors Still not smoking No alcohol now Vision is okay---new glasses Has been looking into hearing aides No falls No depression or anhedonia Independent with instrumental ADLs No memory problems  Knows she gained some weight Not sure why Not exercising ---discussed  Checks sugars every other day---usually 80-110 No low sugar reactions Known HTN No numbness, tingling or pain in her feet Did have eye exam fairly recently  No chest pain No palpitations No SOB No edema No dizziness or syncope  Continues on the statin daily No myalgias  Has cut the pantoprazole to every other day No heartburn No dysphagia  Current Outpatient Medications on File Prior to Visit  Medication Sig Dispense Refill  . atorvastatin (LIPITOR) 20 MG tablet TAKE 1 TABLET BY MOUTH EVERY DAY 90 tablet 3  . glucose blood (ONETOUCH VERIO) test strip Use to check blood sugar once a day Dx Code E11.9 100 each 3  . Lancets (ONETOUCH DELICA PLUS [id number]) MISC CHECK BLOOD SUGAR TWICE DAILY AND AS INSTRUCTED. DX E11.9 100 each 7  . losartan-hydrochlorothiazide (HYZAAR) 50-12.5 MG tablet TAKE 1 TABLET BY MOUTH EVERY DAY 90 tablet 3  . metFORMIN (GLUCOPHAGE) 1000 MG tablet TAKE 1 TABLET (1,000 MG TOTAL) BY MOUTH 2 (TWO) TIMES DAILY WITH A MEAL. 180 tablet 3  . Multiple Vitamins-Minerals (CENTRUM SILVER ULTRA WOMENS PO) Take 1 tablet by mouth daily.    . pantoprazole (PROTONIX) 40 MG  tablet TAKE 1 TABLET BY MOUTH EVERY DAY 90 tablet 3   No current facility-administered medications on file prior to visit.    No Known Allergies  Past Medical History:  Diagnosis Date  . Diabetes mellitus, type 2 ([place])   . Hyperlipidemia   . Hypertension   . Osteoarthritis of both shoulders     Past Surgical History:  Procedure Laterality Date  . BREAST BIOPSY Left 05/17/2016   neg  . COLONOSCOPY WITH PROPOFOL N/A 04/29/2018   Procedure: COLONOSCOPY WITH PROPOFOL;  Surgeon: Toledo, Benay Pike, MD;  Location: [facility] ENDOSCOPY;  Service: Gastroenterology;  Laterality: N/A;  . FOOT FRACTURE SURGERY Right 8/08  . FRACTURE SURGERY    . NO PAST SURGERIES      Family History  Problem Relation Age of Onset  . Alcohol abuse Father   . Hypertension Sister   . Hypertension Sister   . Cancer Neg Hx   . Diabetes Neg Hx   . Heart disease Neg Hx     Social History   Socioeconomic History  . Marital status: Widowed    Spouse name: Not on file  . Number of children: Not on file  . Years of education: Not on file  . Highest education level: Not on file  Occupational History  . 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 Smoking status: Former Smoker    Packs/day: 0.00    Years: 15.00    Pack years: 0.00    Types: Cigarettes  Quit date: 01/07/2019    Years since quitting: 1.1  . Smokeless tobacco: Never Used  Vaping Use  . Vaping Use: Never used  Substance and Sexual Activity  . Alcohol use: Not Currently    Comment:    . Drug use: Never  . Sexual activity: Not on file  Other Topics Concern  . Not on file  Social History Narrative   Widowed ~2014      No living will   Would want sister to make health care decisions--- [name]   Would accept resuscitation--but no prolonged life support   Not sure about tube feeds--but wouldn't want if cognitively unaware   Social Determinants of Health   Financial Resource Strain:     . Difficulty of Paying Living Expenses: Not on file  Food Insecurity:   . Worried About Charity fundraiser in the Last Year: Not on file  . Ran Out of Food in the Last Year: Not on file  Transportation Needs:   . Lack of Transportation (Medical): Not on file  . Lack of Transportation (Non-Medical): Not on file  Physical Activity:   . Days of Exercise per Week: Not on file  . Minutes of Exercise per Session: Not on file  Stress:   . Feeling of Stress : Not on file  Social Connections:   . Frequency of Communication with Friends and Family: Not on file  . Frequency of Social Gatherings with Friends and Family: Not on file  . Attends Religious Services: Not on file  . Active Member of Clubs or Organizations: Not on file  . Attends Archivist Meetings: Not on file  . Marital Status: Not on file  Intimate Partner Violence:   . Fear of Current or Ex-Partner: Not on file  . Emotionally Abused: Not on file  . Physically Abused: Not on file  . Sexually Abused: Not on file   Review of Systems Appetite is fine Weight up some Sleeps well Wears seat belt Needs to have teeth pulled---aching in gums. Looking for dentist No suspicious skin lesions. No rash Bowels are fine---no blood No urinary problems. No incontinence No sig back or joint pains    Objective:   Physical Exam Constitutional:      Appearance: Normal appearance.  HENT:     Mouth/Throat:     Comments: No lesions Eyes:     Conjunctiva/sclera: Conjunctivae normal.     Pupils: Pupils are equal, round, and reactive to light.  Cardiovascular:     Rate and Rhythm: Normal rate and regular rhythm.     Pulses: Normal pulses.     Heart sounds: No murmur heard.  No gallop.   Pulmonary:     Effort: Pulmonary effort is normal.     Breath sounds: Normal breath sounds. No wheezing or rales.  Abdominal:     Palpations: Abdomen is soft.     Tenderness: There is no abdominal tenderness.  Musculoskeletal:     Cervical  back: Neck supple.     Right lower leg: No edema.     Left lower leg: No edema.  Lymphadenopathy:     Cervical: No cervical adenopathy.  Skin:    Findings: No rash.     Comments: No foot lesions  Neurological:     Mental Status: She is alert and oriented to person, place, and time.     Comments: President--- "Biden, Obama----Trump" [phone]--- "I know those are wrong" [id number] Recall--3/3  Normal sensation in feet  Psychiatric:  Mood and Affect: Mood normal.        Behavior: Behavior normal.            Assessment & Plan:

## 2020-02-17 NOTE — Assessment & Plan Note (Signed)
BP Readings from Last 3 Encounters:  02/17/20 138/76  08/14/19 126/70  02/13/19 118/62   Good control on losartan Due for labs

## 2020-02-17 NOTE — Assessment & Plan Note (Signed)
I have personally reviewed the Medicare Annual Wellness questionnaire and have noted 1. The patient's medical and social history 2. Their use of alcohol, tobacco or illicit drugs 3. Their current medications and supplements 4. The patient's functional ability including ADL's, fall risks, home safety risks and hearing or visual             impairment. 5. Diet and physical activities 6. Evidence for depression or mood disorders  The patients weight, height, BMI and visual acuity have been recorded in the chart I have made referrals, counseling and provided education to the patient based review of the above and I have provided the pt with a written personalized care plan for preventive services.  I have provided you with a copy of your personalized plan for preventive services. Please take the time to review along with your updated medication list.  Done with screening colons Is due for mammogram No pap due to age Flu vaccine today COVID booster soon Discussed exercise Consider shingrix at pharmacy

## 2020-02-17 NOTE — Assessment & Plan Note (Signed)
See social history 

## 2020-02-17 NOTE — Assessment & Plan Note (Signed)
Still controlled on every other day pantoprazole

## 2020-02-17 NOTE — Progress Notes (Signed)
Hearing Screening   125Hz  250Hz  500Hz  1000Hz  2000Hz  3000Hz  4000Hz  6000Hz  8000Hz   Right ear:           Left ear:           Comments: Pt had inadequate exam last year. Pt aware she needs hearing aids.  Vision Screening Comments: July 2021

## 2020-02-17 NOTE — Assessment & Plan Note (Signed)
No problems with atorvastatin

## 2020-02-17 NOTE — Addendum Note (Signed)
Addended by: Pilar Grammes on: 02/17/2020 10:06 AM   Modules accepted: Orders

## 2020-02-17 NOTE — Patient Instructions (Signed)
Please set up your screening mammogram and get your COVID booster.

## 2020-03-01 ENCOUNTER — Telehealth: Payer: Self-pay

## 2020-03-01 NOTE — Telephone Encounter (Signed)
Pt said she called [facility] and was advised needed refills on losartan HCTZ 50-12.5 and atorvastatin 20 mg; pt should have available refills on both. I called [facility] and spoke with Estill Bamberg and losartan HCTZ 50-12.5 mg and atorvastatin 20 mg is ready for pick up. Pt voiced understanding and will get meds picked up at pharmacy. Nothing further needed.

## 2020-03-02 ENCOUNTER — Other Ambulatory Visit: Payer: Self-pay | Admitting: Internal Medicine

## 2020-04-05 ENCOUNTER — Telehealth: Payer: Self-pay | Admitting: Internal Medicine

## 2020-04-05 NOTE — Progress Notes (Signed)
  Chronic Care Management   Outreach Note  04/05/2020 Name: Monica Harrington MRN: [id number] DOB: June 05, 1945  Referred by: Karie Schwalbe, MD Reason for referral : Chronic Care Management   An unsuccessful telephone outreach was attempted today. The patient was referred to the pharmacist for assistance with care management and care coordination.   Follow Up Plan:   Aggie Hacker  Upstream Scheduler

## 2020-04-19 ENCOUNTER — Telehealth: Payer: Self-pay | Admitting: Internal Medicine

## 2020-04-19 NOTE — Chronic Care Management (AMB) (Signed)
  Chronic Care Management   Note  04/19/2020 Name: Monica Harrington MRN: [id number] DOB: 12-Mar-1946  Monica Harrington is a 75 y.o. year old female who is a primary care patient of Venia Carbon, MD. I reached out to [employer] by phone today in response to a referral sent by [name]'s PCP, Venia Carbon, MD.   Monica Harrington was given information about Chronic Care Management services today including:  1. CCM service includes personalized support from designated clinical staff supervised by her physician, including individualized plan of care and coordination with other care providers 2. 24/7 contact phone numbers for assistance for urgent and routine care needs. 3. Service will only be billed when office clinical staff spend 20 minutes or more in a month to coordinate care. 4. Only one practitioner may furnish and bill the service in a calendar month. 5. The patient may stop CCM services at any time (effective at the end of the month) by phone call to the office staff.   Patient agreed to services and verbal consent obtained.   Follow up plan:   [place]

## 2020-05-27 ENCOUNTER — Telehealth: Payer: Self-pay

## 2020-05-27 NOTE — Chronic Care Management (AMB) (Addendum)
     Chronic Care Management Pharmacy Assistant   Name: Monica Harrington  MRN: [id number] DOB: [birth year]  Reason for Encounter: Initial questions for CCM visit scheduled 06/01/20  PCP : Venia Carbon, MD  Allergies:  No Known Allergies  Medications: Outpatient Encounter Medications as of 05/27/2020  Medication Sig   atorvastatin (LIPITOR) 20 MG tablet TAKE 1 TABLET BY MOUTH EVERY DAY   glucose blood (ONETOUCH VERIO) test strip Use to check blood sugar once a day Dx Code E11.9   Lancets (ONETOUCH DELICA PLUS [id number]) MISC CHECK BLOOD SUGAR TWICE DAILY AND AS INSTRUCTED. DX E11.9   losartan-hydrochlorothiazide (HYZAAR) 50-12.5 MG tablet TAKE 1 TABLET BY MOUTH EVERY DAY   metFORMIN (GLUCOPHAGE) 1000 MG tablet TAKE 1 TABLET (1,000 MG TOTAL) BY MOUTH 2 (TWO) TIMES DAILY WITH A MEAL.   Multiple Vitamins-Minerals (CENTRUM SILVER ULTRA WOMENS PO) Take 1 tablet by mouth daily.   pantoprazole (PROTONIX) 40 MG tablet TAKE 1 TABLET BY MOUTH EVERY DAY   No facility-administered encounter medications on file as of 05/27/2020.    Current Diagnosis: Patient Active Problem List   Diagnosis Date Noted   Hearing loss 08/13/2016   Type 2 diabetes mellitus with other circulatory complications [facility])    Advance directive discussed with patient 08/10/2015   Preventative health care 06/25/2014   Hyperlipidemia    Hypertension    Osteoarthritis of both hands    GERD 03/12/2007   Multiple attempts made to reach patient for initial questions. Unsuccessful outreach.   Has patient seen any other providers or had any medicine changes since last visit with PCP? No- only able to review chart documentation.  Patient's preferred pharmacy is:  [id number] [place], [place] - [address] [place] [address] Phone: [phone] Fax: [phone]   Left message for patient to have all medications, supplements and any blood glucose and blood pressure readings available for  review with Debbora Dus, Pharm. D, at her telephone visit on 06/01/20 at 8:30 .   Follow-Up:  Pharmacist Review  Debbora Dus, CPP notified  Margaretmary Dys, [place] Assistant [phone]  I have reviewed the care management and care coordination activities outlined in this encounter and I am certifying that I agree with the content of this note. No further action required.  Debbora Dus, PharmD Clinical Pharmacist [facility] [phone]

## 2020-06-01 ENCOUNTER — Ambulatory Visit (INDEPENDENT_AMBULATORY_CARE_PROVIDER_SITE_OTHER): Payer: Medicare Other

## 2020-06-01 ENCOUNTER — Other Ambulatory Visit: Payer: Self-pay

## 2020-06-01 DIAGNOSIS — E785 Hyperlipidemia, unspecified: Secondary | ICD-10-CM | POA: Diagnosis not present

## 2020-06-01 DIAGNOSIS — I10 Essential (primary) hypertension: Secondary | ICD-10-CM

## 2020-06-01 DIAGNOSIS — E1159 Type 2 diabetes mellitus with other circulatory complications: Secondary | ICD-10-CM

## 2020-06-01 DIAGNOSIS — K219 Gastro-esophageal reflux disease without esophagitis: Secondary | ICD-10-CM

## 2020-06-01 NOTE — Progress Notes (Addendum)
Chronic Care Management Pharmacy Note  06/01/2020 Name:  Monica Harrington MRN:  [id number] DOB:  08-02-1945  Subjective: Monica Harrington is an 75 y.o. year old female who is a primary patient of Venia Carbon, MD.  The CCM team was consulted for assistance with disease management and care coordination needs.    Engaged with patient by telephone for follow up visit in response to provider referral for pharmacy case management and/or care coordination services.   Consent to Services:  The patient was given the following information about Chronic Care Management services today, agreed to services, and gave verbal consent: 1. CCM service includes personalized support from designated clinical staff supervised by the primary care provider, including individualized plan of care and coordination with other care providers 2. 24/7 contact phone numbers for assistance for urgent and routine care needs. 3. Service will only be billed when office clinical staff spend 20 minutes or more in a month to coordinate care. 4. Only one practitioner may furnish and bill the service in a calendar month. 5.The patient may stop CCM services at any time (effective at the end of the month) by phone call to the office staff. 6. The patient will be responsible for cost sharing (co-pay) of up to 20% of the service fee (after annual deductible is met). Patient agreed to services and consent obtained.  Patient Care Team: Venia Carbon, MD as PCP - Claris Gower, [facility] as Pharmacist (Pharmacist)  CCM Consent 04/19/20  Recent office visits: 02/17/20 - PCP - DM, Checks BG daily, good control, GERD controlled on 1/2 tablet PPI, Lipids on statin, BP good on losartan.  Recent consult visits: None in past 6 months  Hospital visits: None in previous 6 months  Objective:  Lab Results  Component Value Date   CREATININE 0.86 02/17/2020   BUN 14 02/17/2020   GFR 67.46 02/17/2020   GFRNONAA >60 05/14/2017    GFRAA >60 05/14/2017   NA 138 02/17/2020   K 3.8 02/17/2020   CALCIUM 9.7 02/17/2020   CO2 28 02/17/2020    Lab Results  Component Value Date/Time   HGBA1C 8.1 (H) 02/17/2020 09:15 AM   HGBA1C 6.7 (A) 08/14/2019 08:56 AM   HGBA1C 7.0 (H) 02/13/2019 10:06 AM   GFR 67.46 02/17/2020 09:15 AM   GFR 78.60 02/13/2019 10:06 AM    Last diabetic Eye exam:  Lab Results  Component Value Date/Time   HMDIABEYEEXA No Retinopathy 07/11/2016 12:00 AM    Last diabetic Foot exam:  Lab Results  Component Value Date/Time   HMDIABFOOTEX done 02/17/2020 12:00 AM     Lab Results  Component Value Date   CHOL 116 02/17/2020   HDL 41.10 02/17/2020   LDLCALC 59 02/17/2020   TRIG 82.0 02/17/2020   CHOLHDL 3 02/17/2020    Hepatic Function Latest Ref Rng & Units 02/17/2020 02/17/2020 02/13/2019  Total Protein 6.0 - 8.3 g/dL - 7.7 8.1  Albumin 3.5 - 5.2 g/dL 4.4 4.4 4.6  AST 0 - 37 U/L - 30 25  ALT 0 - 35 U/L - 30 25  Alk Phosphatase 39 - 117 U/L - 60 61  Total Bilirubin 0.2 - 1.2 mg/dL - 0.2 0.3  Bilirubin, Direct 0.0 - 0.3 mg/dL - 0.0 -    Lab Results  Component Value Date/Time   TSH 2.26 12/26/2009 03:16 PM   FREET4 0.71 06/19/2016 11:06 AM   FREET4 0.70 08/10/2015 10:26 AM    CBC Latest Ref Rng & Units  02/17/2020 02/13/2019 02/10/2018  WBC 4.0 - 10.5 K/uL 9.2 10.7(H) 13.1(H)  Hemoglobin 12.0 - 15.0 g/dL 12.2 12.9 13.1  Hematocrit 36.0 - 46.0 % 37.9 39.9 40.8  Platelets 150.0 - 400.0 K/uL 359.0 375.0 416.0(H)    No results found for: VD25OH  Clinical ASCVD: No  The ASCVD Risk score [name], et al., 2013) failed to calculate for the following reasons:   The valid total cholesterol range is 130 to 320 mg/dL    Depression screen [facility] 2/9 02/17/2020 02/13/2019 02/10/2018  Decreased Interest 0 0 0  Down, Depressed, Hopeless 0 0 0  PHQ - 2 Score 0 0 0    No results found for DEXA  Social History   Tobacco Use  Smoking Status Former Smoker  . Packs/day: 0.00  . Years: 15.00   . Pack years: 0.00  . Types: Cigarettes  . Quit date: 01/07/2019  . Years since quitting: 1.4  Smokeless Tobacco Never Used   BP Readings from Last 3 Encounters:  02/17/20 138/76  08/14/19 126/70  02/13/19 118/62   Pulse Readings from Last 3 Encounters:  02/17/20 69  08/14/19 78  02/13/19 70   Wt Readings from Last 3 Encounters:  02/17/20 186 lb (84.4 kg)  08/14/19 179 lb (81.2 kg)  02/13/19 173 lb (78.5 kg)    Assessment/Interventions: Review of patient past medical history, allergies, medications, health status, including review of consultants reports, laboratory and other test data, was performed as part of comprehensive evaluation and provision of chronic care management services.   SDOH:  (Social Determinants of Health) assessments and interventions performed: Yes SDOH Interventions   Flowsheet Row Most Recent Value  SDOH Interventions   Financial Strain Interventions Intervention Not Indicated  [Medications affordable]      CCM Care Plan  No Known Allergies  Medications Reviewed Today    Reviewed by Debbora Dus, [facility] (Pharmacist) on 06/01/20 at [phone]  Med List Status: <None>  Medication Order Taking? Sig Documenting Provider Last Dose Status Informant  atorvastatin (LIPITOR) 20 MG tablet [id number] Yes TAKE 1 TABLET BY MOUTH EVERY DAY Venia Carbon, MD Taking Active   glucose blood (ONETOUCH VERIO) test strip [id number] Yes Use to check blood sugar once a day Dx Code E11.9 Venia Carbon, MD Taking Active   Lancets (ONETOUCH DELICA PLUS [id number]) [place] [id number] Yes CHECK BLOOD SUGAR TWICE DAILY AND AS INSTRUCTED. DX E11.9 Venia Carbon, MD Taking Active   losartan-hydrochlorothiazide [facility]) 50-12.5 MG tablet [id number] Yes TAKE 1 TABLET BY MOUTH EVERY DAY Venia Carbon, MD Taking Active   metFORMIN (GLUCOPHAGE) 1000 MG tablet [id number] Yes TAKE 1 TABLET (1,000 MG TOTAL) BY MOUTH 2 (TWO) TIMES DAILY WITH A MEAL. Venia Carbon, MD Taking Active    Multiple Vitamins-Minerals (CENTRUM SILVER ULTRA WOMENS PO) [id number] Yes Take 1 tablet by mouth daily. [provider] Taking Active   pantoprazole (PROTONIX) 40 MG tablet [id number] Yes TAKE 1 TABLET BY MOUTH EVERY DAY Venia Carbon, MD Taking Active           Patient Active Problem List   Diagnosis Date Noted  . Hearing loss 08/13/2016  . Type 2 diabetes mellitus with other circulatory complications ([place])   . Advance directive discussed with patient 08/10/2015  . Preventative health care 06/25/2014  . Hyperlipidemia   . Hypertension   . Osteoarthritis of both hands   . GERD 03/12/2007    Immunization History  Administered Date(s) Administered  . Fluad Quad(high Dose 65+)  02/13/2019, 02/17/2020  . Influenza,inj,Quad PF,6+ Mos 12/13/2015, 02/13/2017, 02/10/2018  . Influenza-Unspecified 12/22/2013  . PFIZER(Purple Top)SARS-COV-2 Vaccination 07/23/2019, 08/18/2019  . Pneumococcal Conjugate-13 06/25/2014  . Pneumococcal Polysaccharide-23 08/10/2015  . Td 03/12/2007  . Tdap 08/24/2016    Conditions to be addressed/monitored:  Hypertension, Hyperlipidemia, Diabetes and GERD  Care Plan : [place]  Updates made by Debbora Dus, [place] since 06/01/2020 12:00 AM    Problem: CHL AMB "PATIENT-SPECIFIC PROBLEM"     Goal: Disease Management   Start Date: 06/01/2020  Priority: High  Note:    Current Barriers:  . Unable to maintain control of diabetes  Pharmacist Clinical Goal(s):  Marland Kitchen Over the next 30 days, patient will achieve control of diabetes as evidenced by A1c within goal < 7% and post-prandial readings < 180 through collaboration with PharmD and provider.   Interventions: . 1:1 collaboration with Venia Carbon, MD regarding development and update of comprehensive plan of care as evidenced by provider attestation and co-signature . Inter-disciplinary care team collaboration (see longitudinal plan of care) . Comprehensive medication review  performed; medication list updated in electronic medical record  Hypertension (BP goal <140/90) -Controlled -Current treatment: . Losartan/HCTZ 50-12.5 mg - 1 tablet daily -Medications previously tried: none -Current home readings: not checking, does not have a home BP monitor  -Denies hypotensive/hypertensive symptoms -Educated on BP goals and benefits of medications for prevention of heart attack, stroke and kidney damage; Symptoms of hypotension and importance of maintaining adequate hydration; -Recommended to continue current medication  Hyperlipidemia: (LDL goal < 100) -Controlled -Current treatment: . Atorvastatin 20 mg - 1 tablet daily -Medications previously tried: none -Educated on Cholesterol goals;  -Recommended to continue current medication  Diabetes (A1c goal <7%) -Uncontrolled -Current medications: Marland Kitchen Metformin 1000 mg - 1 tablet twice daily with meal -Medications previously tried: none  -Current home glucose readings - every other day  fasting glucose: 129, 118, 120, 125 . post prandial glucose: none  -Denies hypoglycemic/hyperglycemic symptoms -Current meal patterns: tries to watch sugar content in foods  . breakfast: sausage, boiled egg daily, every now and then she has toast, coffee (2 tablespoons sugar and milk)  . lunch: skips . dinner: chicken, pork chop, cabbage, beans, small piece of cornbread . snacks: goldfish, pretzels - watches portions  . drinks: water, occasionally Gatorade - drinks about 1/2 of a 20 ounces or Pepsi less than once a week  -Current exercise: walks 2-3 miles with her niece, twice a week at the [facility] track. She also squats in the evening before bed while watching the news. -Educated onA1c and blood sugar goals; Carbohydrate counting and/or plate method -Counseled to check feet daily and get yearly eye exams - reports she has an eye appointment in May -Recommended to continue current medication; Change timing of BG check to 2  hours after supper. CMA will call in 1 month.   Bone Health Review (Goal: Prevent fractures, improve bone density) Query -Controlled -Last DEXA Scan: none per chart -Current treatment  . Centrum Silver  (Vit D 1000 IU and Calcium 220 mg) . Diet - eats cheese and green vegetables on regular basis, does not like yogurt, milk upsets stomach -Medications previously tried: none   -Recommend [phone] units of vitamin D daily. Recommend 1200 mg of calcium daily from dietary and supplemental sources. Recommend weight-bearing and muscle strengthening exercises for building and maintaining bone density. - Due to age > 10, consider DEXA scan.  GERD (Goal: Control symptoms) -Controlled -Current treatment  .  Pantoprazole 40 mg - 1 tablet every other day -Medications previously tried: none   -Recommended to continue current medication  Patient Goals/Self-Care Activities . Over the next 30 days, patient will:  - take medications as prescribed check glucose every other day 2 hours after supper, document, and provide at future appointments  Follow Up Plan: The care management team will reach out to the patient again over the next 30 days.       Medication Assistance: None required.  Patient affirms current coverage meets needs.  Patient's preferred pharmacy is:  [id number] [place], [id number]: [phone]: [phone] Uses pill box? No - just takes from the pill bottle Pt endorses 100% compliance, denies missed doses. Takes them all before breakfast.  We discussed: Benefits of medication synchronization, packaging and delivery as well as enhanced pharmacist oversight with [employer]. Patient decided to: Continue current medication management strategy  Care Plan and Follow Up Patient Decision:  Patient agrees to Care Plan and Follow-up.  Plan: Telephone follow up appointment with care management team member scheduled  for:  1 month CMA BG log  MDebbora Dus PharmD Clinical Pharmacist LFair HavenPrimary Care at [facility] [phone]   Encounter details: CCM Time Spent      Value Time User   Time spent with patient (minutes)  63 06/01/2020  9:26 AM [facility]us [facility]  Time spent performing Chart review  30 06/01/2020  9:23 AM [facility]a Dus [facility]  Total time (minutes)  93 06/01/2020  9:26 AM ADebbora Dus RPH     Moderate to High Complex Decision Making      Value Time User   Moderate to High complex decision making  Yes 06/01/2020  9:26 [facility]    CCM Services: Complex  I have personally reviewed this encounter including the documentation in this note and have collaborated with the care management provider regarding care management and care coordination activities to include development and update of the comprehensive care plan. I am certifying that I agree with the content of this note and encounter as supervising physician.

## 2020-06-01 NOTE — Patient Instructions (Signed)
June 01, 2020  Dear Monica Harrington,  It was a pleasure meeting you during our initial appointment on June 01, 2020. Below is a summary of the goals we discussed and components of chronic care management. Please contact me anytime with questions or concerns.   Visit Information  Patient Care Plan: CCM Pharmacy Care Plan    Problem Identified: CHL AMB "PATIENT-SPECIFIC PROBLEM"     Goal: Disease Management   Start Date: 06/01/2020  Priority: High  Note:    Current Barriers:  . Unable to maintain control of diabetes  Pharmacist Clinical Goal(s):  Marland Kitchen Over the next 30 days, patient will achieve control of diabetes as evidenced by A1c within goal < 7% and post-prandial readings < 180 through collaboration with PharmD and provider.   Interventions: . 1:1 collaboration with Venia Carbon, MD regarding development and update of comprehensive plan of care as evidenced by provider attestation and co-signature . Inter-disciplinary care team collaboration (see longitudinal plan of care) . Comprehensive medication review performed; medication list updated in electronic medical record  Hypertension (BP goal <140/90) -Controlled -Current treatment: . Losartan/HCTZ 50-12.5 mg - 1 tablet daily -Medications previously tried: none -Current home readings: not checking, does not have a home BP monitor  -Denies hypotensive/hypertensive symptoms -Educated on BP goals and benefits of medications for prevention of heart attack, stroke and kidney damage; Symptoms of hypotension and importance of maintaining adequate hydration; -Recommended to continue current medication  Hyperlipidemia: (LDL goal < 100) -Controlled -Current treatment: . Atorvastatin 20 mg - 1 tablet daily -Medications previously tried: none -Educated on Cholesterol goals;  -Recommended to continue current medication  Diabetes (A1c goal <7%) -Uncontrolled -Current medications: Marland Kitchen Metformin 1000 mg - 1 tablet twice  daily with meal -Medications previously tried: none  -Current home glucose readings - every other day  fasting glucose: 129, 118, 120, 125 . post prandial glucose: none  -Denies hypoglycemic/hyperglycemic symptoms -Current meal patterns: tries to watch sugar content in foods  . breakfast: sausage, boiled egg daily, every now and then she has toast, coffee (2 tablespoons sugar and milk)  . lunch: skips . dinner: chicken, pork chop, cabbage, beans, small piece of cornbread . snacks: goldfish, pretzels - watches portions  . drinks: water, occasionally Gatorade - drinks about 1/2 of a 20 ounces or Pepsi less than once a week  -Current exercise: walks 2-3 miles with her niece, twice a week at the [facility] track. She also squats in the evening before bed while watching the news. -Educated onA1c and blood sugar goals; Carbohydrate counting and/or plate method -Counseled to check feet daily and get yearly eye exams - reports she has an eye appointment in May -Recommended to continue current medication; Change timing of BG check to 2 hours after supper. CMA will call in 1 month.   Bone Health Review (Goal: Prevent fractures, improve bone density) Query -Controlled -Last DEXA Scan: none per chart -Current treatment  . Centrum Silver  (Vit D 1000 IU and Calcium 220 mg) . Diet - eats cheese and green vegetables on regular basis, does not like yogurt, milk upsets stomach -Medications previously tried: none   -Recommend [id number] units of vitamin D daily. Recommend 1200 mg of calcium daily from dietary and supplemental sources. Recommend weight-bearing and muscle strengthening exercises for building and maintaining bone density. - Due to age > 71, consider DEXA scan.  GERD (Goal: Control symptoms) -Controlled -Current treatment  . Pantoprazole 40 mg - 1 tablet every other day -Medications previously  tried: none   -Recommended to continue current medication  Patient Goals/Self-Care  Activities . Over the next 30 days, patient will:  - take medications as prescribed check glucose every other day 2 hours after supper, document, and provide at future appointments  Follow Up Plan: The care management team will reach out to the patient again over the next 30 days.       Monica Harrington was given information about Chronic Care Management services today including:  1. CCM service includes personalized support from designated clinical staff supervised by her physician, including individualized plan of care and coordination with other care providers 2. 24/7 contact phone numbers for assistance for urgent and routine care needs. 3. Standard insurance, coinsurance, copays and deductibles apply for chronic care management only during months in which we provide at least 20 minutes of these services. Most insurances cover these services at 100%, however patients may be responsible for any copay, coinsurance and/or deductible if applicable. This service may help you avoid the need for more expensive face-to-face services. 4. Only one practitioner may furnish and bill the service in a calendar month. 5. The patient may stop CCM services at any time (effective at the end of the month) by phone call to the office staff.  Patient agreed to services and verbal consent obtained.   The patient verbalized understanding of instructions, educational materials, and care plan provided today and agreed to receive a mailed copy of patient instructions, educational materials, and care plan.  The pharmacy team will reach out to the patient again over the next 30 days.   Debbora Dus, PharmD Clinical Pharmacist [facility] at [facility], [facility], [facility] [phone]   Diabetes Mellitus and Nutrition, Adult When you have diabetes, or diabetes mellitus, it is very important to have healthy eating habits because your blood sugar (glucose) levels are greatly affected by what you eat and drink. Eating healthy  foods in the right amounts, at about the same times every day, can help you:  Control your blood glucose.  Lower your risk of heart disease.  Improve your blood pressure.  Reach or maintain a healthy weight. What can affect my meal plan? Every person with diabetes is different, and each person has different needs for a meal plan. Your health care provider may recommend that you work with a dietitian to make a meal plan that is best for you. Your meal plan may vary depending on factors such as:  The calories you need.  The medicines you take.  Your weight.  Your blood glucose, blood pressure, and cholesterol levels.  Your activity level.  Other health conditions you have, such as heart or kidney disease. How do carbohydrates affect me? Carbohydrates, also called carbs, affect your blood glucose level more than any other type of food. Eating carbs naturally raises the amount of glucose in your blood. Carb counting is a method for keeping track of how many carbs you eat. Counting carbs is important to keep your blood glucose at a healthy level, especially if you use insulin or take certain oral diabetes medicines. It is important to know how many carbs you can safely have in each meal. This is different for every person. Your dietitian can help you calculate how many carbs you should have at each meal and for each snack. How does alcohol affect me? Alcohol can cause a sudden decrease in blood glucose (hypoglycemia), especially if you use insulin or take certain oral diabetes medicines. Hypoglycemia can be a life-threatening condition.  Symptoms of hypoglycemia, such as sleepiness, dizziness, and confusion, are similar to symptoms of having too much alcohol.  Do not drink alcohol if: ? Your health care provider tells you not to drink. ? You are pregnant, may be pregnant, or are planning to become pregnant.  If you drink alcohol: ? Do not drink on an empty stomach. ? Limit how much you  use to:  0-1 drink a day for women.  0-2 drinks a day for men. ? Be aware of how much alcohol is in your drink. In the [place] one drink equals one 12 oz bottle of beer (355 mL), one 5 oz glass of wine (148 mL), or one 1 oz glass of hard liquor (44 mL). ? Keep yourself hydrated with water, diet soda, or unsweetened iced tea.  Keep in mind that regular soda, juice, and other mixers may contain a lot of sugar and must be counted as carbs. What are tips for following this plan? Reading food labels  Start by checking the serving size on the "Nutrition Facts" label of packaged foods and drinks. The amount of calories, carbs, fats, and other nutrients listed on the label is based on one serving of the item. Many items contain more than one serving per package.  Check the total grams (g) of carbs in one serving. You can calculate the number of servings of carbs in one serving by dividing the total carbs by 15. For example, if a food has 30 g of total carbs per serving, it would be equal to 2 servings of carbs.  Check the number of grams (g) of saturated fats and trans fats in one serving. Choose foods that have a low amount or none of these fats.  Check the number of milligrams (mg) of salt (sodium) in one serving. Most people should limit total sodium intake to less than 2,300 mg per day.  Always check the nutrition information of foods labeled as "low-fat" or "nonfat." These foods may be higher in added sugar or refined carbs and should be avoided.  Talk to your dietitian to identify your daily goals for nutrients listed on the label. Shopping  Avoid buying canned, pre-made, or processed foods. These foods tend to be high in fat, sodium, and added sugar.  Shop around the outside edge of the grocery store. This is where you will most often find fresh fruits and vegetables, bulk grains, fresh meats, and fresh dairy. Cooking  Use low-heat cooking methods, such as baking, instead of high-heat  cooking methods like deep frying.  Cook using healthy oils, such as olive, canola, or sunflower oil.  Avoid cooking with butter, cream, or high-fat meats. Meal planning  Eat meals and snacks regularly, preferably at the same times every day. Avoid going long periods of time without eating.  Eat foods that are high in fiber, such as fresh fruits, vegetables, beans, and whole grains. Talk with your dietitian about how many servings of carbs you can eat at each meal.  Eat 4-6 oz (112-168 g) of lean protein each day, such as lean meat, chicken, fish, eggs, or tofu. One ounce (oz) of lean protein is equal to: ? 1 oz (28 g) of meat, chicken, or fish. ? 1 egg. ?  cup (62 g) of tofu.  Eat some foods each day that contain healthy fats, such as avocado, nuts, seeds, and fish.   What foods should I eat? Fruits Berries. Apples. Oranges. Peaches. Apricots. Plums. Grapes. Mango. Papaya. Pomegranate. Kiwi. Cherries. Vegetables  Lettuce. Spinach. Leafy greens, including kale, chard, collard greens, and mustard greens. Beets. Cauliflower. Cabbage. Broccoli. Carrots. Green beans. Tomatoes. Peppers. Onions. Cucumbers. Brussels sprouts. Grains Whole grains, such as whole-wheat or whole-grain bread, crackers, tortillas, cereal, and pasta. Unsweetened oatmeal. Quinoa. Brown or wild rice. Meats and other proteins Seafood. Poultry without skin. Lean cuts of poultry and beef. Tofu. Nuts. Seeds. Dairy Low-fat or fat-free dairy products such as milk, yogurt, and cheese. The items listed above may not be a complete list of foods and beverages you can eat. Contact a dietitian for more information. What foods should I avoid? Fruits Fruits canned with syrup. Vegetables Canned vegetables. Frozen vegetables with butter or cream sauce. Grains Refined white flour and flour products such as bread, pasta, snack foods, and cereals. Avoid all processed foods. Meats and other proteins Fatty cuts of meat. Poultry with  skin. Breaded or fried meats. Processed meat. Avoid saturated fats. Dairy Full-fat yogurt, cheese, or milk. Beverages Sweetened drinks, such as soda or iced tea. The items listed above may not be a complete list of foods and beverages you should avoid. Contact a dietitian for more information. Questions to ask a health care provider  Do I need to meet with a diabetes educator?  Do I need to meet with a dietitian?  What number can I call if I have questions?  When are the best times to check my blood glucose? Where to find more information:  American Diabetes Association: diabetes.org  Academy of Nutrition and Dietetics: www.eatright.[employer] of Diabetes and Digestive and Kidney Diseases: [employer]  Association of Diabetes Care and Education Specialists: www.diabeteseducator.org Summary  It is important to have healthy eating habits because your blood sugar (glucose) levels are greatly affected by what you eat and drink.  A healthy meal plan will help you control your blood glucose and maintain a healthy lifestyle.  Your health care provider may recommend that you work with a dietitian to make a meal plan that is best for you.  Keep in mind that carbohydrates (carbs) and alcohol have immediate effects on your blood glucose levels. It is important to count carbs and to use alcohol carefully. This information is not intended to replace advice given to you by your health care provider. Make sure you discuss any questions you have with your health care provider. Document Revised: 03/03/2019 Document Reviewed: 03/03/2019 Elsevier Patient Education  2021 [employer].

## 2020-06-03 ENCOUNTER — Telehealth: Payer: Medicare Other

## 2020-06-13 ENCOUNTER — Other Ambulatory Visit: Payer: Self-pay | Admitting: Internal Medicine

## 2020-06-13 DIAGNOSIS — Z1231 Encounter for screening mammogram for malignant neoplasm of breast: Secondary | ICD-10-CM

## 2020-07-01 ENCOUNTER — Ambulatory Visit
Admission: RE | Admit: 2020-07-01 | Discharge: 2020-07-01 | Disposition: A | Discharge status: Home / Self Care | Payer: Medicare Other | Source: Ambulatory Visit | Attending: Internal Medicine | Admitting: Internal Medicine

## 2020-07-01 ENCOUNTER — Other Ambulatory Visit: Payer: Self-pay

## 2020-07-01 DIAGNOSIS — Z1231 Encounter for screening mammogram for malignant neoplasm of breast: Secondary | ICD-10-CM | POA: Diagnosis not present

## 2020-07-05 ENCOUNTER — Telehealth: Payer: Self-pay

## 2020-07-05 NOTE — Chronic Care Management (AMB) (Addendum)
Chronic Care Management Pharmacy Assistant   Name: Monica Harrington  MRN: [id number] DOB: December 13, 1945  Reason for Encounter: Disease State- Diabetes    Conditions to be addressed/monitored: DMII  Recent office visits:  None since last CCM visit  Recent consult visits:  None since last CCM visit  Hospital visits:  None in previous 6 months  Medications: Outpatient Encounter Medications as of 07/05/2020  Medication Sig   atorvastatin (LIPITOR) 20 MG tablet TAKE 1 TABLET BY MOUTH EVERY DAY   glucose blood (ONETOUCH VERIO) test strip Use to check blood sugar once a day Dx Code E11.9   Lancets (ONETOUCH DELICA PLUS [id number]) MISC CHECK BLOOD SUGAR TWICE DAILY AND AS INSTRUCTED. DX E11.9   losartan-hydrochlorothiazide (HYZAAR) 50-12.5 MG tablet TAKE 1 TABLET BY MOUTH EVERY DAY   metFORMIN (GLUCOPHAGE) 1000 MG tablet TAKE 1 TABLET (1,000 MG TOTAL) BY MOUTH 2 (TWO) TIMES DAILY WITH A MEAL.   Multiple Vitamins-Minerals (CENTRUM SILVER ULTRA WOMENS PO) Take 1 tablet by mouth daily.   pantoprazole (PROTONIX) 40 MG tablet TAKE 1 TABLET BY MOUTH EVERY DAY   No facility-administered encounter medications on file as of 07/05/2020.     Recent Relevant Labs: Lab Results  Component Value Date/Time   HGBA1C 8.1 (H) 02/17/2020 09:15 AM   HGBA1C 6.7 (A) 08/14/2019 08:56 AM   HGBA1C 7.0 (H) 02/13/2019 10:06 AM    Kidney Function Lab Results  Component Value Date/Time   CREATININE 0.86 02/17/2020 09:15 AM   CREATININE 0.86 02/13/2019 10:06 AM   GFR 67.46 02/17/2020 09:15 AM   GFRNONAA >60 05/14/2017 04:40 PM   GFRAA >60 05/14/2017 04:40 PM   Attempted to reach patient 3/29- left voicemail for her to return my call. Was able to reach patient 07/06/20.  Current antihyperglycemic regimen:  Metformin 1000 mg - 1 tablet twice daily with meal   Patient verbally confirms she is taking the above medications as directed. Yes  What recent interventions/DTPs have been made to improve glycemic  control:  Encouraged to change timing of BG checks to 2 hours after evening meal.   Have there been any recent hospitalizations or ED visits since last visit with CPP? No  Patient denies hypoglycemic symptoms, including Pale, Sweaty, Shaky, Hungry, Nervous/irritable and Vision changes  Patient denies hyperglycemic symptoms, including blurry vision, excessive thirst, fatigue, polyuria and weakness  How often are you checking your blood sugar? once daily 2 hours after evening meal  What are your blood sugars ranging?  Fasting: N/A Before meals: N/A After meals:  102, 123, 111, 91, 128, 98, 122, 122 Bedtime: N/A  On insulin? No  During the week, how often does your blood glucose drop below 70? Never  Are you checking your feet daily/regularly? Yes- state feet look good. Denies any wounds or sores.  Adherence Review: Is the patient currently on a STATIN medication? Yes Is the patient currently on ACE/ARB medication? Yes Does the patient have >5 day gap between last estimated fill dates? No  Star Rating Drugs: Medication:  Last Fill: Day Supply Atorvastatin 20 mg      05/26/20 90 Losartan-HCTX 50-12.5  05/30/20 90 Metformin 1000 mg      05/09/20 90   Follow-Up:  Pharmacist Review  Debbora Dus, CPP notified  Margaretmary Dys, [place] Assistant [phone]   I have reviewed the care management and care coordination activities outlined in this encounter and I am certifying that I agree with the content of this note. No further action  required.  Debbora Dus, PharmD Clinical Pharmacist [facility] at [facility] [phone]

## 2020-08-17 ENCOUNTER — Other Ambulatory Visit: Payer: Self-pay

## 2020-08-17 ENCOUNTER — Ambulatory Visit (INDEPENDENT_AMBULATORY_CARE_PROVIDER_SITE_OTHER): Payer: Medicare Other | Admitting: Internal Medicine

## 2020-08-17 ENCOUNTER — Encounter: Payer: Self-pay | Admitting: Internal Medicine

## 2020-08-17 VITALS — BP 128/80 | HR 62 | Temp 97.5°F | Ht 66.0 in | Wt 183.0 lb

## 2020-08-17 DIAGNOSIS — E1159 Type 2 diabetes mellitus with other circulatory complications: Secondary | ICD-10-CM

## 2020-08-17 LAB — POCT GLYCOSYLATED HEMOGLOBIN (HGB A1C): Hemoglobin A1C: 7.3 % — AB (ref 4.0–5.6)

## 2020-08-17 MED ORDER — ONETOUCH DELICA PLUS LANCET33G MISC: 7 refills | Status: DC

## 2020-08-17 NOTE — Assessment & Plan Note (Signed)
Lab Results  Component Value Date   HGBA1C 7.3 (A) 08/17/2020   Better and acceptable with just the metformin She was very upset about the idea of more medication--but we don't need it now BP still okay Is on statin

## 2020-08-17 NOTE — Progress Notes (Signed)
Subjective:    Patient ID: Monica Harrington, female    DOB: Dec 31, 1945, 75 y.o.   MRN: [id number]  HPI Here for follow up of diabetes This visit occurred during the SARS-CoV-2 public health emergency.  Safety protocols were in place, including screening questions prior to the visit, additional usage of staff PPE, and extensive cleaning of exam room while observing appropriate contact time as indicated for disinfecting solutions.   Has cut back on her eating some---had been "eating too much" No problems with metformin Checking sugars every other day Yesterday 88--in the afternoon (or 2 hours post prandial per pharmacist Monica Harrington)  No chest pain  No SOB  Current Outpatient Medications on File Prior to Visit  Medication Sig Dispense Refill  . atorvastatin (LIPITOR) 20 MG tablet TAKE 1 TABLET BY MOUTH EVERY DAY 90 tablet 3  . glucose blood (ONETOUCH VERIO) test strip Use to check blood sugar once a day Dx Code E11.9 100 each 3  . Lancets (ONETOUCH DELICA PLUS [id number]) MISC CHECK BLOOD SUGAR TWICE DAILY AND AS INSTRUCTED. DX E11.9 100 each 7  . losartan-hydrochlorothiazide (HYZAAR) 50-12.5 MG tablet TAKE 1 TABLET BY MOUTH EVERY DAY 90 tablet 3  . metFORMIN (GLUCOPHAGE) 1000 MG tablet TAKE 1 TABLET (1,000 MG TOTAL) BY MOUTH 2 (TWO) TIMES DAILY WITH A MEAL. 180 tablet 3  . Multiple Vitamins-Minerals (CENTRUM SILVER ULTRA WOMENS PO) Take 1 tablet by mouth daily.    . pantoprazole (PROTONIX) 40 MG tablet TAKE 1 TABLET BY MOUTH EVERY DAY 90 tablet 3   No current facility-administered medications on file prior to visit.    No Known Allergies  Past Medical History:  Diagnosis Date  . Diabetes mellitus, type 2 ([place])   . Hyperlipidemia   . Hypertension   . Osteoarthritis of both shoulders     Past Surgical History:  Procedure Laterality Date  . BREAST BIOPSY Left 05/17/2016   neg  . COLONOSCOPY WITH PROPOFOL N/A 04/29/2018   Procedure: COLONOSCOPY WITH PROPOFOL;  Surgeon: [place],  Benay Pike, MD;  Location: [facility] ENDOSCOPY;  Service: Gastroenterology;  Laterality: N/A;  . FOOT FRACTURE SURGERY Right 8/08  . FRACTURE SURGERY    . NO PAST SURGERIES      Family History  Problem Relation Age of Onset  . Alcohol abuse Father   . Hypertension Sister   . Hypertension Sister   . Cancer Neg Hx   . Diabetes Neg Hx   . Heart disease Neg Hx     Social History   Socioeconomic History  . Marital status: Widowed    Spouse name: Not on file  . Number of children: Not on file  . Years of education: Not on file  . Highest education level: Not on file  Occupational History  . 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 Smoking status: Former Smoker    Packs/day: 0.00    Years: 15.00    Pack years: 0.00    Types: Cigarettes    Quit date: 01/07/2019    Years since quitting: 1.6  . Smokeless tobacco: Never Used  Vaping Use  . Vaping Use: Never used  Substance and Sexual Activity  . Alcohol use: Not Currently    Comment:    . Drug use: Never  . Sexual activity: Not on file  Other Topics Concern  . Not on file  Social History Narrative   Widowed ~2014      No  living will   Would want sister to make health care decisions--- Monica Harrington   Would accept resuscitation--but no prolonged life support   Not sure about tube feeds--but wouldn't want if cognitively unaware   Social Determinants of Health   Financial Resource Strain: Low Risk   . Difficulty of Paying Living Expenses: Not very hard  Food Insecurity: Not on file  Transportation Needs: Not on file  Physical Activity: Not on file  Stress: Not on file  Social Connections: Not on file  Intimate Partner Violence: Not on file   Review of Systems Weight is down 3# since last time Still no exercising---but stays active with house/chores    Objective:   Physical Exam Constitutional:      Appearance: Normal appearance.  Cardiovascular:     Rate and Rhythm:  Normal rate and regular rhythm.     Pulses: Normal pulses.     Heart sounds: No murmur heard. No gallop.   Pulmonary:     Effort: Pulmonary effort is normal.     Breath sounds: Normal breath sounds. No wheezing or rales.  Musculoskeletal:     Cervical back: Neck supple.     Right lower leg: No edema.     Left lower leg: No edema.  Lymphadenopathy:     Cervical: No cervical adenopathy.  Neurological:     Mental Status: She is alert.            Assessment & Plan:

## 2020-08-24 ENCOUNTER — Other Ambulatory Visit: Payer: Self-pay | Admitting: Internal Medicine

## 2020-10-04 ENCOUNTER — Telehealth: Payer: Self-pay

## 2020-10-04 NOTE — Chronic Care Management (AMB) (Addendum)
    Chronic Care Management Pharmacy Assistant   Name: Monica Harrington  MRN: [id number] DOB: [birth year]  Reason for Encounter: Disease State  DM   Recent office visits:  08/17/20 - Dr.Letvak PCP - No medication changes. Follow up 6 months.  Recent consult visits:  None since last CCM contact  Hospital visits:  None in previous 6 months  Medications: Outpatient Encounter Medications as of 10/04/2020  Medication Sig   atorvastatin (LIPITOR) 20 MG tablet TAKE 1 TABLET BY MOUTH EVERY DAY   glucose blood (ONETOUCH VERIO) test strip Use to check blood sugar once a day Dx Code E11.9   Lancets (ONETOUCH DELICA PLUS [id number]) MISC CHECK BLOOD SUGAR ONCE DAILY. DX E11.9   losartan-hydrochlorothiazide (HYZAAR) 50-12.5 MG tablet TAKE 1 TABLET BY MOUTH EVERY DAY   metFORMIN (GLUCOPHAGE) 1000 MG tablet TAKE 1 TABLET (1,000 MG TOTAL) BY MOUTH 2 (TWO) TIMES DAILY WITH A MEAL.   Multiple Vitamins-Minerals (CENTRUM SILVER ULTRA WOMENS PO) Take 1 tablet by mouth daily.   pantoprazole (PROTONIX) 40 MG tablet TAKE 1 TABLET BY MOUTH EVERY DAY   No facility-administered encounter medications on file as of 10/04/2020.    Recent Relevant Labs: Lab Results  Component Value Date/Time   HGBA1C 7.3 (A) 08/17/2020 08:25 AM   HGBA1C 8.1 (H) 02/17/2020 09:15 AM   HGBA1C 6.7 (A) 08/14/2019 08:56 AM   HGBA1C 7.0 (H) 02/13/2019 10:06 AM    Kidney Function Lab Results  Component Value Date/Time   CREATININE 0.86 02/17/2020 09:15 AM   CREATININE 0.86 02/13/2019 10:06 AM   GFR 67.46 02/17/2020 09:15 AM   GFRNONAA >60 05/14/2017 04:40 PM   GFRAA >60 05/14/2017 04:40 PM    Current antihyperglycemic regimen:   Metformin 1000mg  - take 1 tablet 2 times daily with meals    What recent interventions/DTPs have been made to improve glycemic control: 08/17/20 PCP, A1c better and acceptable with just the metformin  Have there been any recent hospitalizations or ED visits since last visit with CPP? No   Adherence  Review: Is the patient currently on a STATIN medication? Yes Is the patient currently on ACE/ARB medication? Yes Does the patient have >5 day gap between last estimated fill dates? No  Star Rating Drugs:  Medication:  Last Fill: Day Supply Losartan 50-12.5mg  08/24/20 90 Atorvastatin 20mg  08/24/20 90 Metformin 1000mg  08/04/20 90  Attempted contact with Monica Harrington 3 times on 10/04/20,10/05/20,10/06/20. Unsuccessful outreach. Will attempt contact next month.    Follow-Up:  Pharmacist Review  Debbora Dus, CPP notified  Avel Sensor [facility] Clinical Pharmacy Assistant [phone]  I have reviewed the care management and care coordination activities outlined in this encounter and I am certifying that I agree with the content of this note. No further action required.  Debbora Dus, PharmD Clinical Pharmacist [facility] at [facility] [phone]

## 2020-10-06 ENCOUNTER — Telehealth: Payer: Self-pay | Admitting: Internal Medicine

## 2020-10-06 NOTE — Telephone Encounter (Signed)
Pt stated she was returning a call to [place], [place]. She stated she had a message on her answering machine. I advised her I did not see any messages stating the reason why Larene Beach had called and asked if she received the message today. She said she wasn't sure when the message was left. I let her know I would send the message over letting Larene Beach know she called in.

## 2020-10-13 NOTE — Telephone Encounter (Signed)
I spoke to the pt. I advised her I did not call her but it looked like the pharmacy group tried to contact her last week. I sent a note them that she called our office.

## 2020-10-13 NOTE — Telephone Encounter (Signed)
Pt called [place] stating she was returning a call from me. I have not called her but I see that Avel Sensor has been trying to contact her.

## 2020-10-26 ENCOUNTER — Telehealth: Payer: Self-pay

## 2020-10-26 NOTE — Chronic Care Management (AMB) (Addendum)
Chronic Care Management Pharmacy Assistant   Name: Monica Harrington  MRN: [id number] DOB: January 01, 1946  Reason for Encounter: Disease State DM    Recent office visits: None since last CCM contact  Recent consult visits:  None since last CCM contact  Hospital visits:  None in previous 6 months  Medications: Outpatient Encounter Medications as of 10/26/2020  Medication Sig   atorvastatin (LIPITOR) 20 MG tablet TAKE 1 TABLET BY MOUTH EVERY DAY   glucose blood (ONETOUCH VERIO) test strip Use to check blood sugar once a day Dx Code E11.9   Lancets (ONETOUCH DELICA PLUS [id number]) [place]. DX E11.9   losartan-hydrochlorothiazide (HYZAAR) 50-12.5 MG tablet TAKE 1 TABLET BY MOUTH EVERY DAY   metFORMIN (GLUCOPHAGE) 1000 MG tablet TAKE 1 TABLET (1,000 MG TOTAL) BY MOUTH 2 (TWO) TIMES DAILY WITH A MEAL.   Multiple Vitamins-Minerals (CENTRUM SILVER ULTRA WOMENS PO) Take 1 tablet by mouth daily.   pantoprazole (PROTONIX) 40 MG tablet TAKE 1 TABLET BY MOUTH EVERY DAY   No facility-administered encounter medications on file as of 10/26/2020.     Recent Relevant Labs: Lab Results  Component Value Date/Time   HGBA1C 7.3 (A) 08/17/2020 08:25 AM   HGBA1C 8.1 (H) 02/17/2020 09:15 AM   HGBA1C 6.7 (A) 08/14/2019 08:56 AM   HGBA1C 7.0 (H) 02/13/2019 10:06 AM    Kidney Function Lab Results  Component Value Date/Time   CREATININE 0.86 02/17/2020 09:15 AM   CREATININE 0.86 02/13/2019 10:06 AM   GFR 67.46 02/17/2020 09:15 AM   GFRNONAA >60 05/14/2017 04:40 PM   GFRAA >60 05/14/2017 04:40 PM     Contacted patient on 11/03/20 to discuss diabetes disease state.   Current antihyperglycemic regimen:  Metformin 1000mg  take 1 tablet 2 times daily   What diet changes have been made to improve diabetes control?the patient reports she is watching her carbohydrate intake  What recent interventions/DTPs have been made to improve glycemic control: the patient reports she was  instructed to begin taking her blood sugar at 2 hours after eating a meal   Have there been any recent hospitalizations or ED visits since last visit with CPP? No  Patient denies hypoglycemic symptoms, including Pale, Sweaty, Shaky, Hungry, Nervous/irritable, and Vision changes  Patient denies hyperglycemic symptoms, including blurry vision, excessive thirst, fatigue, polyuria, and weakness  How often are you checking your blood sugar? once daily the patient reports she has been checking blood sugar 2 hours after she eats around mid afternoon  What are your blood sugars ranging?  After meals: 10/28/20- 88          10/31/20- 89          11/02/20- 100  During the week, how often does your blood glucose drop below 70? Never  Are you checking your feet daily/regularly? Yes  Adherence Review: Is the patient currently on a STATIN medication? Yes Is the patient currently on ACE/ARB medication? Yes Does the patient have >5 day gap between last estimated fill dates? No  Care Gaps: Last annual wellness visit: 02/17/2020 Last eye exam / retinopathy screening: sched for august 2022 Last diabetic foot exam: 02/17/2020  Counseled patient on importance of annual eye and foot exam.   Star Rating Drugs:  Medication:  Last Fill: Day Supply Losartan 50-12.5 08/24/20 90 Atorvastatin 20mg  08/24/20 90 Metformin 1000mg  08/04/20 90  PCP appointment on 02/17/21  Debbora Dus, CPP notified  Avel Sensor, [place] Assistant [phone]  I have  reviewed the care management and care coordination activities outlined in this encounter and I am certifying that I agree with the content of this note. No further action required.  Debbora Dus, PharmD Clinical Pharmacist [facility] at [facility] [phone]

## 2020-11-05 ENCOUNTER — Other Ambulatory Visit: Payer: Self-pay | Admitting: Internal Medicine

## 2020-12-20 ENCOUNTER — Telehealth: Payer: Self-pay

## 2020-12-20 NOTE — Chronic Care Management (AMB) (Addendum)
    Chronic Care Management Pharmacy Assistant   Name: NICHA ZORA  MRN: [id number] DOB: [birth year]  Reason for Encounter: Diabetes   Recent office visits:  None since last CCM contact  Recent consult visits:  None since last [facility] visits:  None in previous 6 months  Medications: Outpatient Encounter Medications as of 12/20/2020  Medication Sig   atorvastatin (LIPITOR) 20 MG tablet TAKE 1 TABLET BY MOUTH EVERY DAY   glucose blood (ONETOUCH VERIO) test strip Use to check blood sugar once a day Dx Code E11.9   Lancets (ONETOUCH DELICA PLUS [id number]) [place]. DX E11.9   losartan-hydrochlorothiazide (HYZAAR) 50-12.5 MG tablet TAKE 1 TABLET BY MOUTH EVERY DAY   metFORMIN (GLUCOPHAGE) 1000 MG tablet TAKE 1 TABLET (1,000 MG TOTAL) BY MOUTH 2 (TWO) TIMES DAILY WITH A MEAL.   Multiple Vitamins-Minerals (CENTRUM SILVER ULTRA WOMENS PO) Take 1 tablet by mouth daily.   pantoprazole (PROTONIX) 40 MG tablet TAKE 1 TABLET BY MOUTH EVERY DAY   No facility-administered encounter medications on file as of 12/20/2020.      Recent Relevant Labs: Lab Results  Component Value Date/Time   HGBA1C 7.3 (A) 08/17/2020 08:25 AM   HGBA1C 8.1 (H) 02/17/2020 09:15 AM   HGBA1C 6.7 (A) 08/14/2019 08:56 AM   HGBA1C 7.0 (H) 02/13/2019 10:06 AM    Kidney Function Lab Results  Component Value Date/Time   CREATININE 0.86 02/17/2020 09:15 AM   CREATININE 0.86 02/13/2019 10:06 AM   GFR 67.46 02/17/2020 09:15 AM   GFRNONAA >60 05/14/2017 04:40 PM   GFRAA >60 05/14/2017 04:40 PM     Attempted contact with Saisha W Gunner 3 times on 12/22/20,12/26/20,12/30/20. Unsuccessful outreach. Will attempt contact next month.   Current antihyperglycemic regimen:   Metformin '1000mg'$  take 1 tablet 2 times daily     Adherence Review: Is the patient currently on a STATIN medication? Yes Is the patient currently on ACE/ARB medication? Yes Does the patient have >5 day gap  between last estimated fill dates? No  Care Gaps: Annual wellness visit in last year? Yes  02/17/2020 Most recent A1C reading: 7.3 (08/17/20) Most Recent BP reading:  128/80  62-P (08/17/20)  Star Rating Drugs:  Medication:  Last Fill: Day Supply Metformin '1000mg'$  11/07/20  90 Losartan 50-12.5 mg 11/15/20  90 Atorvastatin '20mg'$  11/15/20  90  PCP appointment on 02/17/21  Debbora Dus, CPP notified  Avel Sensor, [place] Assistant [phone]  I have reviewed the care management and care coordination activities outlined in this encounter and I am certifying that I agree with the content of this note. No further action required.  Debbora Dus, PharmD Clinical Pharmacist [place] [facility] [facility] at [facility] [phone]

## 2021-02-12 ENCOUNTER — Other Ambulatory Visit: Payer: Self-pay | Admitting: Internal Medicine

## 2021-02-17 ENCOUNTER — Other Ambulatory Visit: Payer: Self-pay

## 2021-02-17 ENCOUNTER — Ambulatory Visit (INDEPENDENT_AMBULATORY_CARE_PROVIDER_SITE_OTHER): Payer: Medicare Other | Admitting: Internal Medicine

## 2021-02-17 ENCOUNTER — Encounter: Payer: Self-pay | Admitting: Internal Medicine

## 2021-02-17 VITALS — BP 130/70 | HR 68 | Temp 96.8°F | Ht 65.0 in | Wt 182.0 lb

## 2021-02-17 DIAGNOSIS — Z0000 Encounter for general adult medical examination without abnormal findings: Secondary | ICD-10-CM

## 2021-02-17 DIAGNOSIS — K219 Gastro-esophageal reflux disease without esophagitis: Secondary | ICD-10-CM

## 2021-02-17 DIAGNOSIS — Z7189 Other specified counseling: Secondary | ICD-10-CM

## 2021-02-17 DIAGNOSIS — I10 Essential (primary) hypertension: Secondary | ICD-10-CM | POA: Diagnosis not present

## 2021-02-17 DIAGNOSIS — E1159 Type 2 diabetes mellitus with other circulatory complications: Secondary | ICD-10-CM

## 2021-02-17 DIAGNOSIS — Z23 Encounter for immunization: Secondary | ICD-10-CM | POA: Diagnosis not present

## 2021-02-17 LAB — CBC
HCT: 37.7 % (ref 36.0–46.0)
Hemoglobin: 12.3 g/dL (ref 12.0–15.0)
MCHC: 32.7 g/dL (ref 30.0–36.0)
MCV: 89.3 fl (ref 78.0–100.0)
Platelets: 342 10*3/uL (ref 150.0–400.0)
RBC: 4.22 Mil/uL (ref 3.87–5.11)
RDW: 14.2 % (ref 11.5–15.5)
WBC: 7.4 10*3/uL (ref 4.0–10.5)

## 2021-02-17 LAB — COMPREHENSIVE METABOLIC PANEL
ALT: 30 U/L (ref 0–35)
AST: 29 U/L (ref 0–37)
Albumin: 4.6 g/dL (ref 3.5–5.2)
Alkaline Phosphatase: 60 U/L (ref 39–117)
BUN: 13 mg/dL (ref 6–23)
CO2: 28 mEq/L (ref 19–32)
Calcium: 10 mg/dL (ref 8.4–10.5)
Chloride: 104 mEq/L (ref 96–112)
Creatinine, Ser: 0.83 mg/dL (ref 0.40–1.20)
GFR: 69.9 mL/min (ref >60.00)
Glucose, Bld: 119 mg/dL — ABNORMAL HIGH (ref 70–99)
Potassium: 3.9 mEq/L (ref 3.5–5.1)
Sodium: 140 mEq/L (ref 135–145)
Total Bilirubin: 0.4 mg/dL (ref 0.2–1.2)
Total Protein: 7.7 g/dL (ref 6.0–8.3)

## 2021-02-17 LAB — LIPID PANEL
Cholesterol: 130 mg/dL (ref 0–200)
HDL: 38.9 mg/dL — ABNORMAL LOW (ref >39.00)
LDL Cholesterol: 74 mg/dL (ref 0–99)
NonHDL: 90.65
Total CHOL/HDL Ratio: 3
Triglycerides: 82 mg/dL (ref 0.0–149.0)
VLDL: 16.4 mg/dL (ref 0.0–40.0)

## 2021-02-17 LAB — HEMOGLOBIN A1C: Hgb A1c MFr Bld: 8.5 % — ABNORMAL HIGH (ref 4.6–6.5)

## 2021-02-17 LAB — HM DIABETES FOOT EXAM

## 2021-02-17 NOTE — Progress Notes (Signed)
Subjective:    Patient ID: Monica Harrington, female    DOB: 1946-01-17, 75 y.o.   MRN: [id number]  HPI Here for Medicare wellness visit and follow up of chronic health conditions This visit occurred during the SARS-CoV-2 public health emergency.  Safety protocols were in place, including screening questions prior to the visit, additional usage of staff PPE, and extensive cleaning of exam room while observing appropriate contact time as indicated for disinfecting solutions.   Reviewed advanced directives Reviewed other doctors----[facility] No hospitalizations or surgery in the past year Vision is fine--did have diabetic eye exam Hearing is not good---may look into aides No tobacco or alcohol Walks some--but not set exercise No falls No depression or anhedonia Independent with instrumental ADLs  Checks sugars every other day Usually under 100 No foot numbness, tingling or burning  No chest pain No SOB No palpitations No dizziness or syncope No edema  No heartburn Takes the PPI every other day No dysphagia  Still on statin No myalgia, etc  Current Outpatient Medications on File Prior to Visit  Medication Sig Dispense Refill   atorvastatin (LIPITOR) 20 MG tablet TAKE 1 TABLET BY MOUTH EVERY DAY 90 tablet 3   glucose blood (ONETOUCH VERIO) test strip Use to check blood sugar once a day Dx Code E11.9 100 each 3   Lancets (ONETOUCH DELICA PLUS [id number]) MISC CHECK BLOOD SUGAR ONCE DAILY. DX E11.9 100 each 7   losartan-hydrochlorothiazide (HYZAAR) 50-12.5 MG tablet TAKE 1 TABLET BY MOUTH EVERY DAY 90 tablet 3   metFORMIN (GLUCOPHAGE) 1000 MG tablet TAKE 1 TABLET (1,000 MG TOTAL) BY MOUTH 2 (TWO) TIMES DAILY WITH A MEAL. 180 tablet 3   Multiple Vitamins-Minerals (CENTRUM SILVER ULTRA WOMENS PO) Take 1 tablet by mouth daily.     pantoprazole (PROTONIX) 40 MG tablet TAKE 1 TABLET BY MOUTH EVERY DAY 90 tablet 3   No current facility-administered medications on file prior  to visit.    Not on File  Past Medical History:  Diagnosis Date   Diabetes mellitus, type 2 ([place])    Hyperlipidemia    Hypertension    Osteoarthritis of both shoulders     Past Surgical History:  Procedure Laterality Date   BREAST BIOPSY Left 05/17/2016   neg   COLONOSCOPY WITH PROPOFOL N/A 04/29/2018   Procedure: COLONOSCOPY WITH PROPOFOL;  Surgeon: Toledo, Benay Pike, MD;  Location: [facility] ENDOSCOPY;  Service: Gastroenterology;  Laterality: N/A;   FOOT FRACTURE SURGERY Right 8/08   FRACTURE SURGERY     NO PAST SURGERIES      Family History  Problem Relation Age of Onset   Alcohol abuse Father    Hypertension Sister    Hypertension Sister    Cancer Neg Hx    Diabetes Neg Hx    Heart disease Neg Hx     Social History   Socioeconomic History   Marital status: Widowed    Spouse name: Not on file   Number of children: Not on file   Years of education: Not on file   Highest education level: Not on file  Occupational History   Occupation: Housekeeping    Employer: [employer]: Retired  Tobacco Use   Smoking status: Former    Packs/day: 0.00    Years: 15.00    Pack years: 0.00    Types: Cigarettes    Quit date: 01/07/2019    Years since quitting: 2.1   Smokeless tobacco: Never  Vaping Use  Vaping Use: Never used  Substance and Sexual Activity   Alcohol use: Not Currently    Comment:     Drug use: Never   Sexual activity: Not on file  Other Topics Concern   Not on file  Social History Narrative   Widowed ~2014      No living will   Would want sister to make health care decisions--- [name]   Would accept resuscitation--but no prolonged life support   Not sure about tube feeds--but wouldn't want if cognitively unaware   Social Determinants of Health   Financial Resource Strain: Low Risk    Difficulty of Paying Living Expenses: Not very hard  Food Insecurity: Not on file  Transportation Needs: Not on file  Physical Activity: Not on file   Stress: Not on file  Social Connections: Not on file  Intimate Partner Violence: Not on file   Review of Systems Appetite is good Weight down a few pounds this year Sleeps well Wears seat belt Needs teeth pulled--no dentist now No suspicious skin lesions Bowels move fine--no blood No urinary symptoms--some urgency but no incontinence Minor joint pains---has tylenol arthritis for prn use    Objective:   Physical Exam Constitutional:      Appearance: Normal appearance.  HENT:     Mouth/Throat:     Comments: No lesions Eyes:     Conjunctiva/sclera: Conjunctivae normal.     Pupils: Pupils are equal, round, and reactive to light.  Cardiovascular:     Rate and Rhythm: Normal rate and regular rhythm.     Pulses: Normal pulses.     Heart sounds: No murmur heard.   No gallop.  Pulmonary:     Effort: Pulmonary effort is normal.     Breath sounds: Normal breath sounds. No wheezing or rales.  Abdominal:     Palpations: Abdomen is soft.     Tenderness: There is no abdominal tenderness.  Musculoskeletal:     Cervical back: Neck supple.     Right lower leg: No edema.     Left lower leg: No edema.  Lymphadenopathy:     Cervical: No cervical adenopathy.  Skin:    Findings: No lesion or rash.     Comments: No foot lesions  Neurological:     Mental Status: She is alert and oriented to person, place, and time.     Comments: President---"Biden, Obama---Donald Trump" 100-93-85?? [id number] Recall 3/3  Normal sensation in feet  Psychiatric:        Mood and Affect: Mood normal.        Behavior: Behavior normal.           Assessment & Plan:

## 2021-02-17 NOTE — Assessment & Plan Note (Signed)
Symptoms controlled with pantoprazole every other day

## 2021-02-17 NOTE — Addendum Note (Signed)
Addended by: Pilar Grammes on: 02/17/2021 10:29 AM   Modules accepted: Orders

## 2021-02-17 NOTE — Assessment & Plan Note (Signed)
I have personally reviewed the Medicare Annual Wellness questionnaire and have noted 1. The patient's medical and social history 2. Their use of alcohol, tobacco or illicit drugs 3. Their current medications and supplements 4. The patient's functional ability including ADL's, fall risks, home safety risks and hearing or visual             impairment. 5. Diet and physical activities 6. Evidence for depression or mood disorders  The patients weight, height, BMI and visual acuity have been recorded in the chart I have made referrals, counseling and provided education to the patient based review of the above and I have provided the pt with a written personalized care plan for preventive services.  I have provided you with a copy of your personalized plan for preventive services. Please take the time to review along with your updated medication list.  Flu vaccine today Needs bivalent COVID at pharmacy Consider shingrix when covered Done with screening colons--normal 2020 Mammogram due again in 2023 No pap due to age Discussed exercise

## 2021-02-17 NOTE — Assessment & Plan Note (Signed)
See social history 

## 2021-02-17 NOTE — Assessment & Plan Note (Signed)
BP Readings from Last 3 Encounters:  02/17/21 130/70  08/17/20 128/80  02/17/20 138/76   Good control on losartan/HCTZ Due for labs

## 2021-02-17 NOTE — Assessment & Plan Note (Signed)
Lab Results  Component Value Date   HGBA1C 7.3 (A) 08/17/2020   Seems to have good control on metformin Will check labs

## 2021-02-20 ENCOUNTER — Other Ambulatory Visit: Payer: Self-pay | Admitting: Internal Medicine

## 2021-02-20 DIAGNOSIS — E1159 Type 2 diabetes mellitus with other circulatory complications: Secondary | ICD-10-CM

## 2021-02-20 NOTE — Progress Notes (Signed)
Spoke to pt. She said she does not want to take another medication. I stressed the importance of eating right and made an appointment for labs 05-23-21. I advised that if the labs are still elevated that a new medication would need to be done.

## 2021-04-12 ENCOUNTER — Telehealth: Payer: Self-pay

## 2021-04-12 NOTE — Chronic Care Management (AMB) (Signed)
° ° °  Chronic Care Management Pharmacy Assistant   Name: Monica Harrington  MRN: [id number] DOB: Dec 25, 1945  Reason for Encounter: Reminder Call   Conditions to be addressed/monitored: HTN, HLD, and DMII   Medications: Outpatient Encounter Medications as of 04/12/2021  Medication Sig   atorvastatin (LIPITOR) 20 MG tablet TAKE 1 TABLET BY MOUTH EVERY DAY   glucose blood (ONETOUCH VERIO) test strip Use to check blood sugar once a day Dx Code E11.9   Lancets (ONETOUCH DELICA PLUS [id number]) MISC CHECK BLOOD SUGAR ONCE DAILY. DX E11.9   losartan-hydrochlorothiazide (HYZAAR) 50-12.5 MG tablet TAKE 1 TABLET BY MOUTH EVERY DAY   metFORMIN (GLUCOPHAGE) 1000 MG tablet TAKE 1 TABLET (1,000 MG TOTAL) BY MOUTH 2 (TWO) TIMES DAILY WITH A MEAL.   Multiple Vitamins-Minerals (CENTRUM SILVER ULTRA WOMENS PO) Take 1 tablet by mouth daily.   pantoprazole (PROTONIX) 40 MG tablet TAKE 1 TABLET BY MOUTH EVERY DAY   No facility-administered encounter medications on file as of 04/12/2021.   [name] did not answer the telephone  to remind her of her upcoming telephone visit with Debbora Dus on 04/17/21 at 2:00pm . Patient was reminded to have all medications, supplements and any blood glucose and blood pressure readings available for review at appointment.Detailed message left on machine .     Star Rating Drugs: Medication:   Last Fill: Day Supply Atorvastatin 20mg   02/12/21 90 Losartan/HCTZ 50-12.5 02/12/21 90 Metformin 1000mg   01/30/21 [place], CPP notified  Avel Sensor, [place] Assistant [phone]  Total time spent for month CPA: 10 min.

## 2021-04-17 ENCOUNTER — Ambulatory Visit (INDEPENDENT_AMBULATORY_CARE_PROVIDER_SITE_OTHER): Payer: Medicare Other

## 2021-04-17 ENCOUNTER — Other Ambulatory Visit: Payer: Self-pay

## 2021-04-17 DIAGNOSIS — I10 Essential (primary) hypertension: Secondary | ICD-10-CM

## 2021-04-17 DIAGNOSIS — E1159 Type 2 diabetes mellitus with other circulatory complications: Secondary | ICD-10-CM

## 2021-04-17 DIAGNOSIS — E785 Hyperlipidemia, unspecified: Secondary | ICD-10-CM

## 2021-04-17 NOTE — Patient Instructions (Addendum)
Dear Monica Harrington,  Below is a summary of the goals we discussed during our follow up appointment on April 17, 2021. Please contact me anytime with questions or concerns.   Visit Information  Patient Care Plan: CCM Pharmacy Care Plan     Problem Identified: CHL AMB "PATIENT-SPECIFIC PROBLEM"      Goal: Disease Management   Start Date: 06/01/2020  This Visit's Progress: On track  Priority: High  Note:     Current Barriers:  A1c elevated, however, home BG readings are very good (< 100 after breakfast)  Pharmacist Clinical Goal(s):  Patient will achieve control of diabetes as evidenced by A1c within goal < 7% and post-prandial readings < 180 through collaboration with PharmD and provider.   Interventions: 1:1 collaboration with Venia Carbon, MD regarding development and update of comprehensive plan of care as evidenced by provider attestation and co-signature Inter-disciplinary care team collaboration (see longitudinal plan of care) Comprehensive medication review performed; medication list updated in electronic medical record  Hypertension (BP goal <140/90) -Controlled, per clinic readings -Current treatment: Losartan/HCTZ 50-12.5 mg - 1 tablet daily Appropriate, Effective, Safe, Accessible -Medications previously tried: none -Current home readings: not checking, does not have a home BP monitor  -Denies hypotensive/hypertensive symptoms -Recommended to continue current medication  Hyperlipidemia: (LDL goal < 100) -Controlled, per LDL 74 02/2021 -Current treatment: Atorvastatin 20 mg - 1 tablet daily - Appropriate, Effective, Safe, Accessible -Medications previously tried: none -Reviewed adherence, no gaps in refill data. -Recommended to continue current medication  Diabetes (A1c goal <7%) -Uncontrolled, A1c increased to 8.5% Nov 2022. Home BG readings are within target range after breakfast. Asked her to check before breakfast until follow up. She denies eating  any large meals. Reports all of her meals are small. Thus post-breakfast should reflect average post prandial for the day. -Current medications: Metformin 1000 mg - 1 tablet twice daily with meal - Appropriate, Effective, Safe, Accessible -Medications previously tried: none  -Current home glucose readings - about every other day  fasting glucose: none  post prandial glucose:ranging 89-90 2 hours after breakfast -Denies hypoglycemic/hyperglycemic symptoms -Current meal patterns: no changes to diet, no restrictions -Current exercise: not walking as much lately -Counseled to check feet daily and get yearly eye exams - eye exam last August 2022, reports no diabetic retinopathy, foot exam 02/2021, no problems with feet -Recommended to continue current medication; Recommend monitoring blood glucose before breakfast. Try to increase activity level.  Bone Density/Fracture Prevention (Goal: Screen for osteoporosis) -Last DEXA Scan: none per chart FRAX: 10 - year hip fracture risk 2.7% 10 - year major fracture risk: 12% -Current treatment  Centrum Silver (Vit D 1000 IU and Calcium 220 mg) - 1 tablet daily -Medications previously tried: none   -Recommend [phone] units of vitamin D daily. Recommend 1200 mg of calcium daily from dietary and supplemental sources. Recommend weight-bearing and muscle strengthening exercises for building and maintaining bone density. - Recommend DEXA scan and continue vitamin supplementation  GERD (Goal: Control symptoms) -Controlled, per patient report -Current treatment  Pantoprazole 40 mg - 1 tablet daily  -Medications previously tried: none   -Recommended to continue current medication  Patient Goals/Self-Care Activities Patient will:  - take medications as prescribed - check glucose every other day before breakfast, document, and provide at future appointments  Follow Up Plan:  CCM visit - 12 months or sooner if needed      The patient verbalized  understanding of instructions, educational materials, and care plan provided  today and declined offer to receive copy of patient instructions, educational materials, and care plan.   Debbora Dus, PharmD Clinical Pharmacist Practitioner [place] [facility] at [facility] [phone]

## 2021-04-17 NOTE — Progress Notes (Signed)
Chronic Care Management Pharmacy Note  04/17/2021 Name:  Monica Harrington MRN:  [id number] DOB:  1945/04/30  Summary:  CCM follow up. Discussed DM, HTN, HLD. HTN and HLD stable. No adherence concerns. Last A1c elevated (8.5%), however home readings are running 89-90, 2 hours after breakfast. She checks BG every other day. No longer walking routinely, encouraged increased activity.   Recommendations: Check BG before breakfast  Plan: CCM 12 months or sooner if needed CMA BG log review in 1 month A1c scheduled for February 2023 PCP visit May 2023  Subjective: Monica Harrington is an 76 y.o. year old female who is a primary patient of Venia Carbon, MD.  The CCM team was consulted for assistance with disease management and care coordination needs.    Engaged with patient by telephone for follow up visit in response to provider referral for pharmacy case management and/or care coordination services.   Consent to Services:  The patient was given information about Chronic Care Management services, agreed to services, and gave verbal consent prior to initiation of services.  Please see initial visit note for detailed documentation.   Patient Care Team: Venia Carbon, MD as PCP - Claris Gower, [facility] as Pharmacist (Pharmacist)  Recent office visits: 02/17/21 - PCP - Pt presented for chronic health conditions. A1c elevated, 8.5%. Pt declined medication changes at this time. Will work on diet/lifestyle. Follow up February 2023. GERD controlled on 1/2 tablet PPI, Lipids controlled on statin, BP good on losartan.  Recent consult visits: None in past 6 months  Hospital visits: None in previous 6 months  Objective:  Lab Results  Component Value Date   CREATININE 0.83 02/17/2021   BUN 13 02/17/2021   GFR 69.90 02/17/2021   GFRNONAA >60 05/14/2017   GFRAA >60 05/14/2017   NA 140 02/17/2021   K 3.9 02/17/2021   CALCIUM 10.0 02/17/2021   CO2 28 02/17/2021    Lab  Results  Component Value Date/Time   HGBA1C 8.5 (H) 02/17/2021 09:15 AM   HGBA1C 7.3 (A) 08/17/2020 08:25 AM   HGBA1C 8.1 (H) 02/17/2020 09:15 AM   GFR 69.90 02/17/2021 09:15 AM   GFR 67.46 02/17/2020 09:15 AM    Last diabetic Eye exam:  Lab Results  Component Value Date/Time   HMDIABEYEEXA No Retinopathy 07/11/2016 12:00 AM    Last diabetic Foot exam:  Lab Results  Component Value Date/Time   HMDIABFOOTEX done 02/17/2021 12:00 AM     Lab Results  Component Value Date   CHOL 130 02/17/2021   HDL 38.90 (L) 02/17/2021   LDLCALC 74 02/17/2021   TRIG 82.0 02/17/2021   CHOLHDL 3 02/17/2021    Hepatic Function Latest Ref Rng & Units 02/17/2021 02/17/2020 02/17/2020  Total Protein 6.0 - 8.3 g/dL 7.7 - 7.7  Albumin 3.5 - 5.2 g/dL 4.6 4.4 4.4  AST 0 - 37 U/L 29 - 30  ALT 0 - 35 U/L 30 - 30  Alk Phosphatase 39 - 117 U/L 60 - 60  Total Bilirubin 0.2 - 1.2 mg/dL 0.4 - 0.2  Bilirubin, Direct 0.0 - 0.3 mg/dL - - 0.0    Lab Results  Component Value Date/Time   TSH 2.26 12/26/2009 03:16 PM   FREET4 0.71 06/19/2016 11:06 AM   FREET4 0.70 08/10/2015 10:26 AM    CBC Latest Ref Rng & Units 02/17/2021 02/17/2020 02/13/2019  WBC 4.0 - 10.5 K/uL 7.4 9.2 10.7(H)  Hemoglobin 12.0 - 15.0 g/dL 12.3 12.2 12.9  Hematocrit 36.0 -  46.0 % 37.7 37.9 39.9  Platelets 150.0 - 400.0 K/uL 342.0 359.0 375.0    No results found for: VD25OH  Clinical ASCVD: No  The 10-year ASCVD risk score (Arnett DK, et al., 2019) is: 35.9%   Values used to calculate the score:     Age: 56 years     Sex: Female     Is Non-Hispanic African American: Yes     Diabetic: Yes     Tobacco smoker: Yes     Systolic Blood Pressure: 643 mmHg     Is BP treated: Yes     HDL Cholesterol: 38.9 mg/dL     Total Cholesterol: 130 mg/dL    Depression screen [facility] 2/9 02/17/2021 02/17/2020 02/13/2019  Decreased Interest 0 0 0  Down, Depressed, Hopeless 0 0 0  PHQ - 2 Score 0 0 0    No results found for DEXA  Social History    Tobacco Use  Smoking Status Former   Packs/day: 0.00   Years: 15.00   Pack years: 0.00   Types: Cigarettes   Quit date: 01/07/2019   Years since quitting: 2.2  Smokeless Tobacco Never   BP Readings from Last 3 Encounters:  02/17/21 130/70  08/17/20 128/80  02/17/20 138/76   Pulse Readings from Last 3 Encounters:  02/17/21 68  08/17/20 62  02/17/20 69   Wt Readings from Last 3 Encounters:  02/17/21 182 lb (82.6 kg)  08/17/20 183 lb (83 kg)  02/17/20 186 lb (84.4 kg)    Assessment/Interventions: Review of patient past medical history, allergies, medications, health status, including review of consultants reports, laboratory and other test data, was performed as part of comprehensive evaluation and provision of chronic care management services.   SDOH:  (Social Determinants of Health) assessments and interventions performed: Yes SDOH Interventions    Flowsheet Row Most Recent Value  SDOH Interventions   Financial Strain Interventions Intervention Not Indicated        CCM Care Plan  Not on File  Medications Reviewed Today     Reviewed by Debbora Dus, [facility] (Pharmacist) on 04/17/21 at 1415  Med List Status: <None>   Medication Order Taking? Sig Documenting Provider Last Dose Status Informant  atorvastatin (LIPITOR) 20 MG tablet [id number] Yes TAKE 1 TABLET BY MOUTH EVERY DAY Venia Carbon, MD Taking Active   glucose blood (ONETOUCH VERIO) test strip [id number] Yes Use to check blood sugar once a day Dx Code E11.9 Venia Carbon, MD Taking Active   Lancets (ONETOUCH DELICA PLUS [id number]) [place] [id number] Yes CHECK BLOOD SUGAR ONCE DAILY. DX E11.9 Venia Carbon, MD Taking Active   losartan-hydrochlorothiazide [facility]) 50-12.5 MG tablet [id number] Yes TAKE 1 TABLET BY MOUTH EVERY DAY Venia Carbon, MD Taking Active   metFORMIN (GLUCOPHAGE) 1000 MG tablet [id number] Yes TAKE 1 TABLET (1,000 MG TOTAL) BY MOUTH 2 (TWO) TIMES DAILY WITH A MEAL. Venia Carbon, MD Taking Active   Multiple Vitamins-Minerals (CENTRUM SILVER ULTRA WOMENS PO) [id number] Yes Take 1 tablet by mouth daily. [provider] Taking Active   pantoprazole (PROTONIX) 40 MG tablet [id number] Yes TAKE 1 TABLET BY MOUTH EVERY DAY Venia Carbon, MD Taking Active             Patient Active Problem List   Diagnosis Date Noted   Hearing loss 08/13/2016   Type 2 diabetes mellitus with other circulatory complications [facility])    Advance directive discussed with patient 08/10/2015   Preventative health care 06/25/2014  Hyperlipidemia    Hypertension    Osteoarthritis of both hands    GERD 03/12/2007    Immunization History  Administered Date(s) Administered   Fluad Quad(high Dose 65+) 02/13/2019, 02/17/2020, 02/17/2021   Influenza,inj,Quad PF,6+ Mos 12/13/2015, 02/13/2017, 02/10/2018   Influenza-Unspecified 12/22/2013   PFIZER(Purple Top)SARS-COV-2 Vaccination 07/23/2019, 08/18/2019   Pneumococcal Conjugate-13 06/25/2014   Pneumococcal Polysaccharide-23 08/10/2015   Td 03/12/2007   Tdap 08/24/2016    Conditions to be addressed/monitored:  Hypertension, Hyperlipidemia, Diabetes and GERD  Care Plan : [place]  Updates made by Debbora Dus, [place] since 04/17/2021 12:00 AM     Problem: CHL AMB "PATIENT-SPECIFIC PROBLEM"      Goal: Disease Management   Start Date: 06/01/2020  This Visit's Progress: On track  Priority: High  Note:     Current Barriers:  A1c elevated, however, home BG readings are very good (< 100 after breakfast)  Pharmacist Clinical Goal(s):  Patient will achieve control of diabetes as evidenced by A1c within goal < 7% and post-prandial readings < 180 through collaboration with PharmD and provider.   Interventions: 1:1 collaboration with Venia Carbon, MD regarding development and update of comprehensive plan of care as evidenced by provider attestation and co-signature Inter-disciplinary care team collaboration  (see longitudinal plan of care) Comprehensive medication review performed; medication list updated in electronic medical record  Hypertension (BP goal <140/90) -Controlled, per clinic readings -Current treatment: Losartan/HCTZ 50-12.5 mg - 1 tablet daily Appropriate, Effective, Safe, Accessible -Medications previously tried: none -Current home readings: not checking, does not have a home BP monitor  -Denies hypotensive/hypertensive symptoms -Recommended to continue current medication  Hyperlipidemia: (LDL goal < 100) -Controlled, per LDL 74 02/2021 -Current treatment: Atorvastatin 20 mg - 1 tablet daily - Appropriate, Effective, Safe, Accessible -Medications previously tried: none -Reviewed adherence, no gaps in refill data. -Recommended to continue current medication  Diabetes (A1c goal <7%) -Uncontrolled, A1c increased to 8.5% Nov 2022. Home BG readings are within target range after breakfast. Asked her to check before breakfast until follow up. She denies eating any large meals. Reports all of her meals are small. Thus post-breakfast should reflect average post prandial for the day. -Current medications: Metformin 1000 mg - 1 tablet twice daily with meal - Appropriate, Effective, Safe, Accessible -Medications previously tried: none  -Current home glucose readings - about every other day  fasting glucose: none  post prandial glucose:ranging 89-90 2 hours after breakfast -Denies hypoglycemic/hyperglycemic symptoms -Current meal patterns: no changes to diet, no restrictions -Current exercise: not walking as much lately -Counseled to check feet daily and get yearly eye exams - eye exam last August 2022, reports no diabetic retinopathy, foot exam 02/2021, no problems with feet -Recommended to continue current medication; Recommend monitoring blood glucose before breakfast. Try to increase activity level.  Bone Density/Fracture Prevention (Goal: Screen for osteoporosis) -Last DEXA  Scan: none per chart FRAX: 10 - year hip fracture risk 2.7% 10 - year major fracture risk: 12% -Current treatment  Centrum Silver (Vit D 1000 IU and Calcium 220 mg) - 1 tablet daily -Medications previously tried: none   -Recommend [phone] units of vitamin D daily. Recommend 1200 mg of calcium daily from dietary and supplemental sources. Recommend weight-bearing and muscle strengthening exercises for building and maintaining bone density. - Recommend DEXA scan and continue vitamin supplementation  GERD (Goal: Control symptoms) -Controlled, per patient report -Current treatment  Pantoprazole 40 mg - 1 tablet daily  -Medications previously tried: none   -Recommended to  continue current medication  Patient Goals/Self-Care Activities Patient will:  - take medications as prescribed - check glucose every other day before breakfast, document, and provide at future appointments  Follow Up Plan:  CCM visit - 12 months or sooner if needed    Medication Assistance: None required.  Patient affirms current coverage meets needs.  Care Gaps: DEXA Scan - recommend completion Annual eye exam - pt reports this is up to date 11/2020  Adherence Review: Medication:                            Last Fill:         Day Supply Atorvastatin 39m                   02/12/21            90 Losartan/HCTZ 50-12.5          02/12/21            90 Metformin 10065m                 01/30/21          90   Patient[facility]  [id number] [place] [id number]: [phone]: [phone] pill box? No -   Pt endorses 100% compliance, denies missed doses. Takes them all before breakfast.  Care Plan and Follow Up Patient Decision:  Patient agrees to Care Plan and Follow-up.  MicDebbora DusharmD Clinical Pharmacist [phone] as supervising physician.

## 2021-05-09 DIAGNOSIS — E1159 Type 2 diabetes mellitus with other circulatory complications: Secondary | ICD-10-CM

## 2021-05-09 DIAGNOSIS — I10 Essential (primary) hypertension: Secondary | ICD-10-CM | POA: Diagnosis not present

## 2021-05-09 DIAGNOSIS — E785 Hyperlipidemia, unspecified: Secondary | ICD-10-CM

## 2021-05-23 ENCOUNTER — Other Ambulatory Visit: Payer: Self-pay

## 2021-05-23 ENCOUNTER — Other Ambulatory Visit (INDEPENDENT_AMBULATORY_CARE_PROVIDER_SITE_OTHER): Payer: Medicare Other

## 2021-05-23 ENCOUNTER — Other Ambulatory Visit: Payer: Medicare Other

## 2021-05-23 DIAGNOSIS — E1159 Type 2 diabetes mellitus with other circulatory complications: Secondary | ICD-10-CM

## 2021-05-23 LAB — HEMOGLOBIN A1C: Hgb A1c MFr Bld: 8.2 % — ABNORMAL HIGH (ref 4.6–6.5)

## 2021-05-24 ENCOUNTER — Telehealth: Payer: Self-pay

## 2021-05-24 NOTE — Chronic Care Management (AMB) (Addendum)
° ° °  Chronic Care Management Pharmacy Assistant   Name: Monica Harrington  MRN: [id number] DOB: 18-Aug-1945  Reason for Encounter:  Diabetes Disease State   Recent office visits:  None since last CCM contact  Recent consult visits:  None since last CCM contact  Hospital visits:  None in previous 6 months  Medications: Outpatient Encounter Medications as of 05/24/2021  Medication Sig   atorvastatin (LIPITOR) 20 MG tablet TAKE 1 TABLET BY MOUTH EVERY DAY   glucose blood (ONETOUCH VERIO) test strip Use to check blood sugar once a day Dx Code E11.9   Lancets (ONETOUCH DELICA PLUS [id number]) [place]. DX E11.9   losartan-hydrochlorothiazide (HYZAAR) 50-12.5 MG tablet TAKE 1 TABLET BY MOUTH EVERY DAY   metFORMIN (GLUCOPHAGE) 1000 MG tablet TAKE 1 TABLET (1,000 MG TOTAL) BY MOUTH 2 (TWO) TIMES DAILY WITH A MEAL.   Multiple Vitamins-Minerals (CENTRUM SILVER ULTRA WOMENS PO) Take 1 tablet by mouth daily.   pantoprazole (PROTONIX) 40 MG tablet TAKE 1 TABLET BY MOUTH EVERY DAY   No facility-administered encounter medications on file as of 05/24/2021.      Recent Relevant Labs: Lab Results  Component Value Date/Time   HGBA1C 8.2 (H) 05/23/2021 09:17 AM   HGBA1C 8.5 (H) 02/17/2021 09:15 AM   Attempted contact with Monica Harrington 3 times on 05/24/21,05/26/21,05/29/21. Unsuccessful outreach. Will attempt contact next month.   Current antihyperglycemic regimen:  Metformin 1000 mg - 1 tablet twice daily with meal  Adherence Review: Is the patient currently on a STATIN medication? Yes Is the patient currently on ACE/ARB medication? Yes Does the patient have >5 day gap between last estimated fill dates? No  Care Gaps: Annual wellness visit in last year? Yes Most recent A1C reading:8.2% 05/22/21 Most Recent BP reading:130/70  68-P  02/17/21  Last eye exam / retinopathy screening:2018 Last diabetic foot exam: 02/17/21  Counseled patient on importance of annual eye  and foot exam.   Star Rating Drugs:  Medication:   Last Fill: Day Supply Metformin 1000mg   04/26/21 90 Losartan/hctz 50-12.5mg  05/10/21  90 Atorvastatin 20mg   05/10/21  90   No appointments scheduled within the next 30 days.  Debbora Dus, PharmD, CPP notified  Avel Sensor, [place] Assistant [phone]  I have reviewed the care management and care coordination activities outlined in this encounter and I am certifying that I agree with the content of this note. No further action required.  Debbora Dus, PharmD Clinical Pharmacist [facility] at [facility] [phone]

## 2021-05-24 NOTE — Telephone Encounter (Signed)
Left message for pt to call office. Per Dr Silvio Pate: Her diabetes control is slightly better--but still not where it should be Find out if she is willing to start the jardiance 10mg  daily If not, she should work even harder and we will recheck it again in May

## 2021-05-25 ENCOUNTER — Encounter: Payer: Self-pay | Admitting: Internal Medicine

## 2021-05-25 MED ORDER — EMPAGLIFLOZIN 10 MG PO TABS: 10.0000 mg | ORAL_TABLET | Freq: Every day | ORAL | 11 refills | Status: DC

## 2021-05-25 NOTE — Telephone Encounter (Signed)
error 

## 2021-05-25 NOTE — Telephone Encounter (Signed)
Read message below as written, patient would like to start the jardiance  Please [facility] to  [id number] [place], [place] - [address] Phone:  [phone]  Fax:  [phone]

## 2021-05-25 NOTE — Telephone Encounter (Signed)
Sent in [place] to local pharmacy.

## 2021-06-29 ENCOUNTER — Telehealth: Payer: Self-pay

## 2021-06-29 NOTE — Chronic Care Management (AMB) (Addendum)
? ? ?Chronic Care Management ?Pharmacy Assistant  ? ?Name: Monica Harrington  MRN: [id number] DOB: 1945-05-14 ? ? ? ?Reason for Encounter:Diabetes  Disease State ?  ? ?Recent office visits:  ?None since last CCM contact ? ?Recent consult visits:  ?None since last CCM contact ? ?Hospital visits:  ?None in previous 6 months ? ?Medications: ?Outpatient Encounter Medications as of 06/29/2021  ?Medication Sig  ? atorvastatin (LIPITOR) 20 MG tablet TAKE 1 TABLET BY MOUTH EVERY DAY  ? empagliflozin (JARDIANCE) 10 MG TABS tablet Take 1 tablet (10 mg total) by mouth daily before breakfast.  ? glucose blood (ONETOUCH VERIO) test strip Use to check blood sugar once a day Dx Code E11.9  ? Lancets (ONETOUCH DELICA PLUS [id number]) MISC CHECK BLOOD SUGAR ONCE DAILY. DX E11.9  ? losartan-hydrochlorothiazide (HYZAAR) 50-12.5 MG tablet TAKE 1 TABLET BY MOUTH EVERY DAY  ? metFORMIN (GLUCOPHAGE) 1000 MG tablet TAKE 1 TABLET (1,000 MG TOTAL) BY MOUTH 2 (TWO) TIMES DAILY WITH A MEAL.  ? Multiple Vitamins-Minerals (CENTRUM SILVER ULTRA WOMENS PO) Take 1 tablet by mouth daily.  ? pantoprazole (PROTONIX) 40 MG tablet TAKE 1 TABLET BY MOUTH EVERY DAY  ? ?No facility-administered encounter medications on file as of 06/29/2021.  ? ? ?Recent Relevant Labs: ?Lab Results  ?Component Value Date/Time  ? HGBA1C 8.2 (H) 05/23/2021 09:17 AM  ? HGBA1C 8.5 (H) 02/17/2021 09:15 AM  ?  ?Kidney Function ?Lab Results  ?Component Value Date/Time  ? CREATININE 0.83 02/17/2021 09:15 AM  ? CREATININE 0.86 02/17/2020 09:15 AM  ? GFR 69.90 02/17/2021 09:15 AM  ? GFRNONAA >60 05/14/2017 04:40 PM  ? GFRAA >60 05/14/2017 04:40 PM  ? ? ? ?Contacted patient on 07/06/21 to discuss diabetes disease state.  ? ?Current antihyperglycemic regimen:  ? Metformin '1000mg'$ -1 tablet twice daily with meal ?  Jardiance  '10mg'$ - take 1 tablet daily before breakfast ?  Patient verbally confirms she is taking the above medications as directed. Yes ? ?What diet changes have been made to improve  diabetes control? No diet changes per the patient  ? ?What recent interventions/DTPs have been made to improve glycemic control:  ?05/24/21- PCP added Jardiance '10mg'$  take 1 tablet daily am for better diabetes control ? ?Have there been any recent hospitalizations or ED visits since last visit with CPP? No ? ?Patient denies hypoglycemic symptoms, including Pale, Sweaty, Shaky, Hungry, Nervous/irritable, and Vision changes ? ?Patient denies hyperglycemic symptoms, including blurry vision, excessive thirst, fatigue, polyuria, and weakness ? ?How often are you checking your blood sugar? once daily ? ?What are your blood sugars ranging?  ? Fasting:   07/06/21-88 ?             Ranging  [phone] ? ?During the week, how often does your blood glucose drop below 70? Never ? ?Are you checking your feet daily/regularly? Yes ? ?Counseled patient on importance of annual eye and foot exam. Encouraged the patient to schedule eye exam . ? ? ?  ?Adherence Review: ?Is the patient currently on a STATIN medication? Yes ?Is the patient currently on ACE/ARB medication? Yes ?Does the patient have >5 day gap between last estimated fill dates? No ? ?Care Gaps: ?Annual wellness visit in last year? Yes ?Most recent A1C reading:8.2% 05/22/21 ?Most Recent BP reading:130/70  68-P  02/17/21 ?  ?Last eye exam / retinopathy screening:2018 ?Last diabetic foot exam: 02/17/21 ? ? ?Star Rating Drugs:  ?Medication:   Last Fill: Day Supply ?Metformin '1000mg'$   04/26/21            90 ?Losartan/hctz 50-12.'5mg'$         05/10/21              90 ?Atorvastatin '20mg'$                    05/10/21              90 ? ?PCP appointment on 08/18/21 ? ? Charlene Brooke, CPP notified ? ?Maimouna Rondeau, CCMA ?Health concierge  ?[phone]  ?

## 2021-08-07 ENCOUNTER — Other Ambulatory Visit: Payer: Self-pay | Admitting: Internal Medicine

## 2021-08-18 ENCOUNTER — Ambulatory Visit: Payer: Medicare Other | Admitting: Internal Medicine

## 2021-08-22 ENCOUNTER — Encounter: Payer: Self-pay | Admitting: Internal Medicine

## 2021-08-22 ENCOUNTER — Ambulatory Visit (INDEPENDENT_AMBULATORY_CARE_PROVIDER_SITE_OTHER): Payer: Medicare Other | Admitting: Internal Medicine

## 2021-08-22 VITALS — BP 98/60 | HR 64 | Temp 97.0°F | Ht 65.0 in | Wt 178.0 lb

## 2021-08-22 DIAGNOSIS — E1159 Type 2 diabetes mellitus with other circulatory complications: Secondary | ICD-10-CM

## 2021-08-22 DIAGNOSIS — I10 Essential (primary) hypertension: Secondary | ICD-10-CM | POA: Diagnosis not present

## 2021-08-22 LAB — POCT GLYCOSYLATED HEMOGLOBIN (HGB A1C): Hemoglobin A1C: 7.5 % — AB (ref 4.0–5.6)

## 2021-08-22 NOTE — Assessment & Plan Note (Signed)
Lab Results  ?Component Value Date  ? HGBA1C 7.5 (A) 08/22/2021  ? ?Much better now with the addition of jardiance 10 to the metformin 1000 bid ? ?

## 2021-08-22 NOTE — Progress Notes (Signed)
? ?Subjective:  ? ? Patient ID: BEANCA KIESTER, female    DOB: 09-05-1945, 76 y.o.   MRN: [id number] ? ?HPI ?Here for follow up of diabetes ? ?No problems on the jardiance ?Checking sugars every other day ?Usually between 90-10 ?Working on healthy eating ?No recent exercise--discussed ? ?Current Outpatient Medications on File Prior to Visit  ?Medication Sig Dispense Refill  ? atorvastatin (LIPITOR) 20 MG tablet TAKE 1 TABLET BY MOUTH EVERY DAY 90 tablet 0  ? empagliflozin (JARDIANCE) 10 MG TABS tablet Take 1 tablet (10 mg total) by mouth daily before breakfast. 30 tablet 11  ? glucose blood (ONETOUCH VERIO) test strip Use to check blood sugar once a day Dx Code E11.9 100 each 3  ? Lancets (ONETOUCH DELICA PLUS [id number]) MISC CHECK BLOOD SUGAR ONCE DAILY. DX E11.9 100 each 7  ? losartan-hydrochlorothiazide (HYZAAR) 50-12.5 MG tablet TAKE 1 TABLET BY MOUTH EVERY DAY 90 tablet 0  ? metFORMIN (GLUCOPHAGE) 1000 MG tablet TAKE 1 TABLET (1,000 MG TOTAL) BY MOUTH 2 (TWO) TIMES DAILY WITH A MEAL. 180 tablet 3  ? Multiple Vitamins-Minerals (CENTRUM SILVER ULTRA WOMENS PO) Take 1 tablet by mouth daily.    ? pantoprazole (PROTONIX) 40 MG tablet TAKE 1 TABLET BY MOUTH EVERY DAY 90 tablet 3  ? ?No current facility-administered medications on file prior to visit.  ? ? ?No Known Allergies ? ?Past Medical History:  ?Diagnosis Date  ? Diabetes mellitus, type 2 ([place])   ? Hyperlipidemia   ? Hypertension   ? Osteoarthritis of both shoulders   ? ? ?Past Surgical History:  ?Procedure Laterality Date  ? BREAST BIOPSY Left 05/17/2016  ? neg  ? COLONOSCOPY WITH PROPOFOL N/A 04/29/2018  ? Procedure: COLONOSCOPY WITH PROPOFOL;  Surgeon: Toledo, Benay Pike, MD;  Location: [facility] ENDOSCOPY;  Service: Gastroenterology;  Laterality: N/A;  ? FOOT FRACTURE SURGERY Right 8/08  ? FRACTURE SURGERY    ? NO PAST SURGERIES    ? ? ?Family History  ?Problem Relation Age of Onset  ? Alcohol abuse Father   ? Hypertension Sister   ? Hypertension Sister   ? Cancer  Neg Hx   ? Diabetes Neg Hx   ? Heart disease Neg Hx   ? ? ?Social History  ? ?Socioeconomic History  ? Marital status: Widowed  ?  Spouse name: Not on file  ? Number of children: Not on file  ? Years of education: Not on file  ? Highest education level: Not on file  ?Occupational History  ? Occupation: Housekeeping  ?  Employer: [employer]  ?  Comment: Retired  ?Tobacco Use  ? Smoking status: Former  ?  Packs/day: 0.00  ?  Years: 15.00  ?  Pack years: 0.00  ?  Types: Cigarettes  ?  Quit date: 01/07/2019  ?  Years since quitting: 2.6  ? Smokeless tobacco: Never  ?Vaping Use  ? Vaping Use: Never used  ?Substance and Sexual Activity  ? Alcohol use: Not Currently  ?  Comment:    ? Drug use: Never  ? Sexual activity: Not on file  ?Other Topics Concern  ? Not on file  ?Social History Narrative  ? Widowed ~2014  ?   ? No living will  ? Would want sister to make health care decisions--- [name]  ? Would accept resuscitation--but no prolonged life support  ? Not sure about tube feeds--but wouldn't want if cognitively unaware  ? ?Social Determinants of Health  ? ?Financial Resource Strain: Low  Risk   ? Difficulty of Paying Living Expenses: Not very hard  ?Food Insecurity: Not on file  ?Transportation Needs: Not on file  ?Physical Activity: Not on file  ?Stress: Not on file  ?Social Connections: Not on file  ?Intimate Partner Violence: Not on file  ? ?Review of Systems ?Sleeping well ?Weight is down a few pounds ?   ?Objective:  ? Physical Exam ?Constitutional:   ?   Appearance: Normal appearance.  ?Cardiovascular:  ?   Rate and Rhythm: Normal rate and regular rhythm.  ?   Pulses: Normal pulses.  ?   Heart sounds: No murmur heard. ?  No gallop.  ?Pulmonary:  ?   Effort: Pulmonary effort is normal.  ?   Breath sounds: Normal breath sounds. No wheezing or rales.  ?Musculoskeletal:  ?   Cervical back: Neck supple.  ?Lymphadenopathy:  ?   Cervical: No cervical adenopathy.  ?Neurological:  ?   Mental Status: She is alert.   ?Psychiatric:     ?   Mood and Affect: Mood normal.     ?   Behavior: Behavior normal.  ?  ? ? ? ? ?   ?Assessment & Plan:  ? ?

## 2021-08-22 NOTE — Assessment & Plan Note (Signed)
BP Readings from Last 3 Encounters:  ?08/22/21 98/60  ?02/17/21 130/70  ?08/17/20 128/80  ? ?Doing well on the losartan/HCTZ 50/12.5 ?No orthostatic symptoms ?

## 2021-10-18 ENCOUNTER — Other Ambulatory Visit: Payer: Self-pay | Admitting: Internal Medicine

## 2021-11-04 ENCOUNTER — Other Ambulatory Visit: Payer: Self-pay | Admitting: Internal Medicine

## 2021-12-22 ENCOUNTER — Telehealth: Payer: Self-pay | Admitting: Internal Medicine

## 2021-12-22 NOTE — Telephone Encounter (Signed)
Pt called requesting a call back stated RX  empagliflozin (JARDIANCE) 10 MG TABS tablet has gone up in cost would like to discuss alternative . Please advise [phone]

## 2021-12-25 NOTE — Telephone Encounter (Signed)
She is probably in the donut hole and it should go back down again in January. If she can afford the higher price for a few months, that is the best bet. If she can't afford it, stop the jardiance and send Rx for glipizide XL '5mg'$  daily (#90 x 3) She should monitor her sugars daily for a while if she changed---to make sure she doesn't get low sugar reactions

## 2022-02-01 ENCOUNTER — Other Ambulatory Visit: Payer: Self-pay | Admitting: Internal Medicine

## 2022-03-07 ENCOUNTER — Encounter: Payer: Self-pay | Admitting: Internal Medicine

## 2022-03-07 ENCOUNTER — Ambulatory Visit (INDEPENDENT_AMBULATORY_CARE_PROVIDER_SITE_OTHER): Payer: Medicare Other | Admitting: Internal Medicine

## 2022-03-07 VITALS — BP 112/72 | HR 74 | Temp 97.1°F | Ht 65.25 in | Wt 184.0 lb

## 2022-03-07 DIAGNOSIS — Z23 Encounter for immunization: Secondary | ICD-10-CM | POA: Diagnosis not present

## 2022-03-07 DIAGNOSIS — I10 Essential (primary) hypertension: Secondary | ICD-10-CM

## 2022-03-07 DIAGNOSIS — K219 Gastro-esophageal reflux disease without esophagitis: Secondary | ICD-10-CM | POA: Diagnosis not present

## 2022-03-07 DIAGNOSIS — E1159 Type 2 diabetes mellitus with other circulatory complications: Secondary | ICD-10-CM | POA: Diagnosis not present

## 2022-03-07 DIAGNOSIS — E785 Hyperlipidemia, unspecified: Secondary | ICD-10-CM

## 2022-03-07 DIAGNOSIS — Z0000 Encounter for general adult medical examination without abnormal findings: Secondary | ICD-10-CM

## 2022-03-07 LAB — HEMOGLOBIN A1C: Hgb A1c MFr Bld: 8.5 % — ABNORMAL HIGH (ref 4.6–6.5)

## 2022-03-07 LAB — MICROALBUMIN / CREATININE URINE RATIO
Creatinine,U: 92.2 mg/dL
Microalb Creat Ratio: 1.2 mg/g (ref 0.0–30.0)
Microalb, Ur: 1.2 mg/dL (ref 0.0–1.9)

## 2022-03-07 LAB — CBC
HCT: 38.7 % (ref 36.0–46.0)
Hemoglobin: 12.7 g/dL (ref 12.0–15.0)
MCHC: 32.8 g/dL (ref 30.0–36.0)
MCV: 88.9 fl (ref 78.0–100.0)
Platelets: 354 10*3/uL (ref 150.0–400.0)
RBC: 4.35 Mil/uL (ref 3.87–5.11)
RDW: 15.1 % (ref 11.5–15.5)
WBC: 8.7 10*3/uL (ref 4.0–10.5)

## 2022-03-07 LAB — LIPID PANEL
Cholesterol: 130 mg/dL (ref 0–200)
HDL: 40.2 mg/dL (ref >39.00)
LDL Cholesterol: 72 mg/dL (ref 0–99)
NonHDL: 89.9
Total CHOL/HDL Ratio: 3
Triglycerides: 90 mg/dL (ref 0.0–149.0)
VLDL: 18 mg/dL (ref 0.0–40.0)

## 2022-03-07 LAB — TSH: TSH: 1.45 u[IU]/mL (ref 0.35–5.50)

## 2022-03-07 LAB — COMPREHENSIVE METABOLIC PANEL
ALT: 29 U/L (ref 0–35)
AST: 28 U/L (ref 0–37)
Albumin: 4.4 g/dL (ref 3.5–5.2)
Alkaline Phosphatase: 67 U/L (ref 39–117)
BUN: 15 mg/dL (ref 6–23)
CO2: 28 mEq/L (ref 19–32)
Calcium: 9.9 mg/dL (ref 8.4–10.5)
Chloride: 102 mEq/L (ref 96–112)
Creatinine, Ser: 0.76 mg/dL (ref 0.40–1.20)
GFR: 77.12 mL/min (ref >60.00)
Glucose, Bld: 135 mg/dL — ABNORMAL HIGH (ref 70–99)
Potassium: 4 mEq/L (ref 3.5–5.1)
Sodium: 139 mEq/L (ref 135–145)
Total Bilirubin: 0.4 mg/dL (ref 0.2–1.2)
Total Protein: 8.1 g/dL (ref 6.0–8.3)

## 2022-03-07 LAB — HM DIABETES FOOT EXAM

## 2022-03-07 NOTE — Assessment & Plan Note (Signed)
Does well with daily pantoprazole Discussed holding 1-2 days a week if no symptoms

## 2022-03-07 NOTE — Patient Instructions (Signed)
Please set up your screening mammogram. You can get the updated COVID vaccine and the shingles vaccine (shingrix) at your pharmacy.

## 2022-03-07 NOTE — Assessment & Plan Note (Signed)
I have personally reviewed the Medicare Annual Wellness questionnaire and have noted 1. The patient's medical and social history 2. Their use of alcohol, tobacco or illicit drugs 3. Their current medications and supplements 4. The patient's functional ability including ADL's, fall risks, home safety risks and hearing or visual             impairment. 5. Diet and physical activities 6. Evidence for depression or mood disorders  The patients weight, height, BMI and visual acuity have been recorded in the chart I have made referrals, counseling and provided education to the patient based review of the above and I have provided the pt with a written personalized care plan for preventive services.  I have provided you with a copy of your personalized plan for preventive services. Please take the time to review along with your updated medication list.  Done with colonoscopies Needs screening mammogram--then can consider stopping No pap due to age Discussed exercise Flu vaccine today Updated COVID and shingrix at pharmacy

## 2022-03-07 NOTE — Progress Notes (Signed)
Subjective:    Patient ID: Monica Harrington, female    DOB: March 07, 1946, 76 y.o.   MRN: [id number]  HPI Here for Medicare wellness visit and follow up of chronic health conditions Reviewed advanced directives Reviewed other doctors---[facility] No hospitalizations or surgery Vision is okay. Missed an eye exam but is going back Hearing is not that good--not ready for aides No alcohol or tobacco now No regular exercise knows she needs to start No falls No depression or anhedonia Independent with instrumental ADLs No memory problems  Jardiance went from $48 to $160's---too much Didn't get the message about changing to glipizide AM sugars 130 ---- 80-120 after breakfast Tries to be careful with eating  No chest pain or SOB No palpitations No dizziness or syncope No edema No headaches  Uses pantoprazole daily No heartburn or dysphagia on this Did have trouble when she tried off it  No problems with statin  Current Outpatient Medications on File Prior to Visit  Medication Sig Dispense Refill   atorvastatin (LIPITOR) 20 MG tablet TAKE 1 TABLET BY MOUTH EVERY DAY 90 tablet 1   glucose blood (ONETOUCH VERIO) test strip Use to check blood sugar once a day Dx Code E11.9 100 each 3   Lancets (ONETOUCH DELICA PLUS EXHBZJ69C) MISC CHECK BLOOD SUGAR ONCE DAILY. DX E11.9 100 each 7   losartan-hydrochlorothiazide (HYZAAR) 50-12.5 MG tablet TAKE 1 TABLET BY MOUTH EVERY DAY 90 tablet 1   metFORMIN (GLUCOPHAGE) 1000 MG tablet TAKE 1 TABLET (1,000 MG TOTAL) BY MOUTH 2 (TWO) TIMES DAILY WITH A MEAL. 180 tablet 3   Multiple Vitamins-Minerals (CENTRUM SILVER ULTRA WOMENS PO) Take 1 tablet by mouth daily.     pantoprazole (PROTONIX) 40 MG tablet TAKE 1 TABLET BY MOUTH EVERY DAY 90 tablet 0   No current facility-administered medications on file prior to visit.    No Known Allergies  Past Medical History:  Diagnosis Date   Diabetes mellitus, type 2 ([place])    Hyperlipidemia     Hypertension    Osteoarthritis of both shoulders     Past Surgical History:  Procedure Laterality Date   BREAST BIOPSY Left 05/17/2016   neg   COLONOSCOPY WITH PROPOFOL N/A 04/29/2018   Procedure: COLONOSCOPY WITH PROPOFOL;  Surgeon: Toledo, Benay Pike, MD;  Location: [facility];  Service: Gastroenterology;  Laterality: N/A;   FOOT FRACTURE SURGERY Right 8/08   FRACTURE SURGERY     NO PAST SURGERIES      Family History  Problem Relation Age of Onset   Alcohol abuse Father    Hypertension Sister    Hypertension Sister    Cancer Neg Hx    Diabetes Neg Hx    Heart disease Neg Hx     Social History   Socioeconomic History   Marital status: Widowed    Spouse name: Not on file   Number of children: Not on file   Years of education: Not on file   Highest education level: Not on file  Occupational History   Occupation: Housekeeping    Employer: [employer]: Retired  Tobacco Use   Smoking status: Former    Packs/day: 0.00    Years: 15.00    Total pack years: 0.00    Types: Cigarettes    Quit date: 01/07/2019    Years since quitting: 3.1    Passive exposure: Never   Smokeless tobacco: Never  Vaping Use   Vaping Use: Never used  Substance and  Sexual Activity   Alcohol use: Not Currently    Comment:     Drug use: Never   Sexual activity: Not on file  Other Topics Concern   Not on file  Social History Narrative   Widowed ~2014      No living will   Would want sister to make health care decisions--- [name]   Would accept resuscitation--but no prolonged life support   Not sure about tube feeds--but wouldn't want if cognitively unaware   Social Determinants of Health   Financial Resource Strain: Low Risk  (04/17/2021)   Overall Financial Resource Strain (CARDIA)    Difficulty of Paying Living Expenses: Not very hard  Food Insecurity: Not on file  Transportation Needs: Not on file  Physical Activity: Not on file  Stress: Not on file  Social  Connections: Not on file  Intimate Partner Violence: Not on file   Review of Systems Appetite is okay Weight up and down--but mostly stable Sleeps well Wears seat belt Some top teeth need extraction--needs to find dentist Bowels fine--no blood Voids fine---no incontinence No sig back or joint pains Nos suspicious skin lesions    Objective:   Physical Exam Constitutional:      Appearance: Normal appearance.  HENT:     Mouth/Throat:     Comments: No lesions Eyes:     Conjunctiva/sclera: Conjunctivae normal.     Pupils: Pupils are equal, round, and reactive to light.  Cardiovascular:     Rate and Rhythm: Normal rate and regular rhythm.     Pulses: Normal pulses.     Heart sounds: No murmur heard.    No gallop.  Pulmonary:     Effort: Pulmonary effort is normal.     Breath sounds: Normal breath sounds. No wheezing or rales.  Abdominal:     Palpations: Abdomen is soft.     Tenderness: There is no abdominal tenderness.  Musculoskeletal:     Cervical back: Neck supple.     Right lower leg: No edema.     Left lower leg: No edema.  Lymphadenopathy:     Cervical: No cervical adenopathy.  Skin:    Findings: No lesion or rash.     Comments: No foot lesons  Neurological:     General: No focal deficit present.     Mental Status: She is alert and oriented to person, place, and time.     Comments: Mini-cog normal Normal sensation in feet  Psychiatric:        Mood and Affect: Mood normal.        Behavior: Behavior normal.            Assessment & Plan:

## 2022-03-07 NOTE — Progress Notes (Signed)
Vision Screening   Right eye Left eye Both eyes  Without correction     With correction '20/30 20/25 20/25 '$  Hearing Screening - Comments:: Aware of hearing loss.

## 2022-03-07 NOTE — Assessment & Plan Note (Signed)
No problems with atorvastatin 20 

## 2022-03-07 NOTE — Assessment & Plan Note (Signed)
Control seems okay on metformin 1000 bid Stopped the jardiance If control worse, will start glipizide '5mg'$  daily

## 2022-03-07 NOTE — Assessment & Plan Note (Addendum)
BP Readings from Last 3 Encounters:  03/07/22 112/72  08/22/21 98/60  02/17/21 130/70   Good control on the losartan/HCTZ 50/12.5 Will check labs

## 2022-03-12 ENCOUNTER — Telehealth: Payer: Self-pay

## 2022-03-12 NOTE — Telephone Encounter (Signed)
  Pilar Grammes, [place] 03/12/2022  5:01 PM EST Back to Top    Tried calling pt, again. No answer. No VM.   Pilar Grammes, CMA 03/09/2022  4:10 PM EST     Tried to call pt. Someone answered but did not say anything. Tried to call back and the line was busy.

## 2022-03-12 NOTE — Telephone Encounter (Signed)
-----   Message from Venia Carbon, MD sent at 03/08/2022  1:42 PM EST ----- Please call her The diabetes control has slipped again---send Rx for glipizide XL '5mg'$ ---once in the morning(#90 x 3) Confirm she will start Set up appt in 3 months to make sure things are better The rest of the labs were normal--send her copy

## 2022-03-16 ENCOUNTER — Other Ambulatory Visit: Payer: Self-pay

## 2022-03-16 MED ORDER — GLIPIZIDE ER 5 MG PO TB24: 5.0000 mg | ORAL_TABLET | Freq: Every day | ORAL | 3 refills | Status: DC

## 2022-05-04 ENCOUNTER — Other Ambulatory Visit: Payer: Self-pay | Admitting: Internal Medicine

## 2022-05-07 ENCOUNTER — Other Ambulatory Visit: Payer: Self-pay | Admitting: Internal Medicine

## 2022-07-06 ENCOUNTER — Ambulatory Visit: Payer: Medicare Other | Admitting: Internal Medicine

## 2022-07-23 ENCOUNTER — Other Ambulatory Visit: Payer: Self-pay | Admitting: Internal Medicine

## 2022-07-23 DIAGNOSIS — Z1231 Encounter for screening mammogram for malignant neoplasm of breast: Secondary | ICD-10-CM

## 2022-08-13 ENCOUNTER — Ambulatory Visit
Admission: RE | Admit: 2022-08-13 | Discharge: 2022-08-13 | Disposition: A | Discharge status: Home / Self Care | Payer: Medicare Other | Source: Ambulatory Visit | Attending: Internal Medicine | Admitting: Internal Medicine

## 2022-08-13 DIAGNOSIS — Z1231 Encounter for screening mammogram for malignant neoplasm of breast: Secondary | ICD-10-CM | POA: Diagnosis not present

## 2022-09-05 ENCOUNTER — Ambulatory Visit (INDEPENDENT_AMBULATORY_CARE_PROVIDER_SITE_OTHER): Payer: Medicare Other | Admitting: Internal Medicine

## 2022-09-05 ENCOUNTER — Encounter: Payer: Self-pay | Admitting: Internal Medicine

## 2022-09-05 VITALS — BP 112/62 | HR 74 | Temp 97.3°F | Ht 65.25 in | Wt 188.0 lb

## 2022-09-05 DIAGNOSIS — Z7984 Long term (current) use of oral hypoglycemic drugs: Secondary | ICD-10-CM | POA: Diagnosis not present

## 2022-09-05 DIAGNOSIS — E1159 Type 2 diabetes mellitus with other circulatory complications: Secondary | ICD-10-CM | POA: Diagnosis not present

## 2022-09-05 DIAGNOSIS — I10 Essential (primary) hypertension: Secondary | ICD-10-CM | POA: Diagnosis not present

## 2022-09-05 LAB — POCT GLYCOSYLATED HEMOGLOBIN (HGB A1C): Hemoglobin A1C: 7.7 % — AB (ref 4.0–5.6)

## 2022-09-05 MED ORDER — ONETOUCH VERIO VI STRP: ORAL_STRIP | 3 refills | Status: DC

## 2022-09-05 MED ORDER — ONETOUCH DELICA PLUS LANCET33G MISC: 7 refills | Status: DC

## 2022-09-05 NOTE — Addendum Note (Signed)
Addended by: Eual Fines on: 09/05/2022 08:03 AM   Modules accepted: Orders

## 2022-09-05 NOTE — Assessment & Plan Note (Signed)
BP Readings from Last 3 Encounters:  09/05/22 112/62  03/07/22 112/72  08/22/21 98/60   Controlled with losartan /HCTZ 50/12.5

## 2022-09-05 NOTE — Progress Notes (Signed)
Subjective:    Patient ID: Monica Harrington, female    DOB: June 10, 1945, 77 y.o.   MRN: [id number]  HPI Here for follow up of poorly controlled diabetes  No problems with the glipizide Does check sugars--- 80-120 two hours after breakfast No low sugar reactions No foot numbness or burning  No chest pain  No SOB No dizziness or syncope  Current Outpatient Medications on File Prior to Visit  Medication Sig Dispense Refill   atorvastatin (LIPITOR) 20 MG tablet TAKE 1 TABLET BY MOUTH EVERY DAY 90 tablet 3   glipiZIDE (GLUCOTROL XL) 5 MG 24 hr tablet Take 1 tablet (5 mg total) by mouth daily with breakfast. 90 tablet 3   glucose blood (ONETOUCH VERIO) test strip Use to check blood sugar once a day Dx Code E11.9 100 each 3   Lancets (ONETOUCH DELICA PLUS LANCET33G) MISC CHECK BLOOD SUGAR ONCE DAILY. DX E11.9 100 each 7   losartan-hydrochlorothiazide (HYZAAR) 50-12.5 MG tablet TAKE 1 TABLET BY MOUTH EVERY DAY 90 tablet 3   metFORMIN (GLUCOPHAGE) 1000 MG tablet TAKE 1 TABLET (1,000 MG TOTAL) BY MOUTH 2 (TWO) TIMES DAILY WITH A MEAL. 180 tablet 3   Multiple Vitamins-Minerals (CENTRUM SILVER ULTRA WOMENS PO) Take 1 tablet by mouth daily.     pantoprazole (PROTONIX) 40 MG tablet TAKE 1 TABLET BY MOUTH EVERY DAY 90 tablet 3   No current facility-administered medications on file prior to visit.    No Known Allergies  Past Medical History:  Diagnosis Date   Diabetes mellitus, type 2 ([facility])    Hyperlipidemia    Hypertension    Osteoarthritis of both shoulders     Past Surgical History:  Procedure Laterality Date   BREAST BIOPSY Left 05/17/2016   neg   COLONOSCOPY WITH PROPOFOL N/A 04/29/2018   Procedure: COLONOSCOPY WITH PROPOFOL;  Surgeon: Toledo, Boykin Nearing, MD;  Location: [facility];  Service: Gastroenterology;  Laterality: N/A;   FOOT FRACTURE SURGERY Right 8/08   FRACTURE SURGERY     NO PAST SURGERIES      Family History  Problem Relation Age of Onset   Alcohol abuse  Father    Hypertension Sister    Hypertension Sister    Cancer Neg Hx    Diabetes Neg Hx    Heart disease Neg Hx     Social History   Socioeconomic History   Marital status: Widowed    Spouse name: Not on file   Number of children: Not on file   Years of education: Not on file   Highest education level: Not on file  Occupational History   Occupation: Housekeeping    Employer: [employer]    Comment: Retired  Tobacco Use   Smoking status: Former    Packs/day: 0.00    Years: 15.00    Additional pack years: 0.00    Total pack years: 0.00    Types: Cigarettes    Quit date: 01/07/2019    Years since quitting: 3.6    Passive exposure: Never   Smokeless tobacco: Never  Vaping Use   Vaping Use: Never used  Substance and Sexual Activity   Alcohol use: Not Currently    Comment:     Drug use: Never   Sexual activity: Not on file  Other Topics Concern   Not on file  Social History Narrative   Widowed ~2014      No living will   Would want sister to make health care decisions--- Elease Hashimoto  Would accept resuscitation--but no prolonged life support   Not sure about tube feeds--but wouldn't want if cognitively unaware   Social Determinants of Health   Financial Resource Strain: Low Risk  (04/17/2021)   Overall Financial Resource Strain (CARDIA)    Difficulty of Paying Living Expenses: Not very hard  Food Insecurity: Not on file  Transportation Needs: Not on file  Physical Activity: Not on file  Stress: Not on file  Social Connections: Not on file  Intimate Partner Violence: Not on file   Review of Systems Appetite is good Weight is up a few pounds Continues to walk regularly     Objective:   Physical Exam Constitutional:      Appearance: Normal appearance.  Cardiovascular:     Rate and Rhythm: Normal rate and regular rhythm.     Heart sounds: No murmur heard.    No gallop.  Pulmonary:     Effort: Pulmonary effort is normal.     Breath sounds: Normal breath  sounds. No wheezing or rales.  Musculoskeletal:     Cervical back: Neck supple.     Right lower leg: No edema.     Left lower leg: No edema.  Lymphadenopathy:     Cervical: No cervical adenopathy.  Skin:    Comments: No foot lesions  Neurological:     Mental Status: She is alert.  Psychiatric:        Mood and Affect: Mood normal.        Behavior: Behavior normal.            Assessment & Plan:

## 2022-09-05 NOTE — Assessment & Plan Note (Signed)
A1c now down to 7.7% with addition of glipizide 5mg  to metformin 1000 bid She will watch for low sugar reactions

## 2022-09-08 DIAGNOSIS — D494 Neoplasm of unspecified behavior of bladder: Secondary | ICD-10-CM

## 2022-09-08 HISTORY — DX: Neoplasm of unspecified behavior of bladder: D49.4

## 2022-09-22 ENCOUNTER — Other Ambulatory Visit: Payer: Self-pay

## 2022-09-22 ENCOUNTER — Emergency Department
Admission: EM | Admit: 2022-09-22 | Discharge: 2022-09-22 | Disposition: A | Discharge status: Home / Self Care | Payer: Medicare Other | Attending: Emergency Medicine | Admitting: Emergency Medicine

## 2022-09-22 ENCOUNTER — Emergency Department: Payer: Medicare Other

## 2022-09-22 DIAGNOSIS — I10 Essential (primary) hypertension: Secondary | ICD-10-CM | POA: Insufficient documentation

## 2022-09-22 DIAGNOSIS — R319 Hematuria, unspecified: Secondary | ICD-10-CM | POA: Diagnosis not present

## 2022-09-22 DIAGNOSIS — E119 Type 2 diabetes mellitus without complications: Secondary | ICD-10-CM | POA: Insufficient documentation

## 2022-09-22 DIAGNOSIS — N3289 Other specified disorders of bladder: Secondary | ICD-10-CM | POA: Diagnosis not present

## 2022-09-22 LAB — CBC WITH DIFFERENTIAL/PLATELET
Abs Immature Granulocytes: 0.05 10*3/uL (ref 0.00–0.07)
Basophils Absolute: 0.1 10*3/uL (ref 0.0–0.1)
Basophils Relative: 1 %
Eosinophils Absolute: 0.2 10*3/uL (ref 0.0–0.5)
Eosinophils Relative: 3 %
HCT: 38.9 % (ref 36.0–46.0)
Hemoglobin: 12.3 g/dL (ref 12.0–15.0)
Immature Granulocytes: 1 %
Lymphocytes Relative: 26 %
Lymphs Abs: 2.3 10*3/uL (ref 0.7–4.0)
MCH: 28.4 pg (ref 26.0–34.0)
MCHC: 31.6 g/dL (ref 30.0–36.0)
MCV: 89.8 fL (ref 80.0–100.0)
Monocytes Absolute: 0.8 10*3/uL (ref 0.1–1.0)
Monocytes Relative: 9 %
Neutro Abs: 5.4 10*3/uL (ref 1.7–7.7)
Neutrophils Relative %: 60 %
Platelets: 359 10*3/uL (ref 150–400)
RBC: 4.33 MIL/uL (ref 3.87–5.11)
RDW: 14.3 % (ref 11.5–15.5)
WBC: 8.9 10*3/uL (ref 4.0–10.5)
nRBC: 0 % (ref 0.0–0.2)

## 2022-09-22 LAB — BASIC METABOLIC PANEL
Anion gap: 11 (ref 5–15)
BUN: 17 mg/dL (ref 8–23)
CO2: 21 mmol/L — ABNORMAL LOW (ref 22–32)
Calcium: 9.4 mg/dL (ref 8.9–10.3)
Chloride: 106 mmol/L (ref 98–111)
Creatinine, Ser: 0.76 mg/dL (ref 0.44–1.00)
GFR, Estimated: >60 mL/min (ref >60)
Glucose, Bld: 110 mg/dL — ABNORMAL HIGH (ref 70–99)
Potassium: 4 mmol/L (ref 3.5–5.1)
Sodium: 138 mmol/L (ref 135–145)

## 2022-09-22 LAB — URINALYSIS, ROUTINE W REFLEX MICROSCOPIC: RBC / HPF: >50 RBC/hpf (ref 0–5)

## 2022-09-22 MED ORDER — IOHEXOL 300 MG/ML  SOLN
100.0000 mL | Freq: Once | INTRAMUSCULAR | Status: AC | PRN
  Administered 2022-09-22: 100 mL via INTRAVENOUS

## 2022-09-22 NOTE — Discharge Instructions (Addendum)
Your CAT scan is concerning for bladder cancer as the cause of your blood in your urine.  I would like you to follow-up with urology.  Please call the office on Monday to schedule an appointment.  They will be able to see your records.

## 2022-09-22 NOTE — ED Triage Notes (Signed)
Patient states blood in urine for one week, denies pain.

## 2022-09-22 NOTE — ED Provider Notes (Addendum)
[facility] Provider Note    Event Date/Time   First MD Initiated Contact with Patient 09/22/22 1249     (approximate)   History   Hematuria   HPI  Monica Harrington is a 77 y.o. female past medical history of diabetes, hypertension, hyperlipidemia who presents with hematuria.  Patient noticed this about 5 days ago but seems to be worsening.  She endorses dark red urine and it is bloody throughout the urinary stream.  No bleeding in between urination.  She denies any associate abdominal pain back pain or flank pain no fevers or chills no nausea vomiting no history of similar she is not anticoagulated no smoking history     Past Medical History:  Diagnosis Date   Diabetes mellitus, type 2 ([facility])    Hyperlipidemia    Hypertension    Osteoarthritis of both shoulders     Patient Active Problem List   Diagnosis Date Noted   Hearing loss 08/13/2016   Type 2 diabetes mellitus with other circulatory complications ([facility])    Advance directive discussed with patient 08/10/2015   Preventative health care 06/25/2014   Hyperlipidemia    Hypertension    Osteoarthritis of both hands    GERD 03/12/2007     Physical Exam  Triage Vital Signs: ED Triage Vitals  Enc Vitals Group     BP 09/22/22 1202 (!) 166/97     Pulse Rate 09/22/22 1202 69     Resp 09/22/22 1202 18     Temp 09/22/22 1202 98.1 F (36.7 C)     Temp Source 09/22/22 1202 Oral     SpO2 09/22/22 1202 98 %     Weight 09/22/22 1159 188 lb (85.3 kg)     Height 09/22/22 1159 5\' 5"  (1.651 m)     Head Circumference --      Peak Flow --      Pain Score 09/22/22 1200 0     Pain Loc --      Pain Edu? --      Excl. in GC? --     Most recent vital signs: Vitals:   09/22/22 1202  BP: (!) 166/97  Pulse: 69  Resp: 18  Temp: 98.1 F (36.7 C)  SpO2: 98%     General: Awake, no distress.  CV:  Good peripheral perfusion.  Resp:  Normal effort.  Abd:  No distention.  Abdomen is soft  nontender Neuro:             Awake, Alert, Oriented x 3  Other:  No CVA tenderness   ED Results / Procedures / Treatments  Labs (all labs ordered are listed, but only abnormal results are displayed) Labs Reviewed  URINALYSIS, ROUTINE W REFLEX MICROSCOPIC - Abnormal; Notable for the following components:      Result Value   Color, Urine RED (*)    APPearance CLOUDY (*)    Glucose, UA   (*)    Value: TEST NOT REPORTED DUE TO COLOR INTERFERENCE OF URINE PIGMENT   Hgb urine dipstick   (*)    Value: TEST NOT REPORTED DUE TO COLOR INTERFERENCE OF URINE PIGMENT   Bilirubin Urine   (*)    Value: TEST NOT REPORTED DUE TO COLOR INTERFERENCE OF URINE PIGMENT   Ketones, ur   (*)    Value: TEST NOT REPORTED DUE TO COLOR INTERFERENCE OF URINE PIGMENT   Protein, ur   (*)    Value: TEST NOT REPORTED DUE TO  COLOR INTERFERENCE OF URINE PIGMENT   Nitrite   (*)    Value: TEST NOT REPORTED DUE TO COLOR INTERFERENCE OF URINE PIGMENT   Leukocytes,Ua   (*)    Value: TEST NOT REPORTED DUE TO COLOR INTERFERENCE OF URINE PIGMENT   Bacteria, UA FEW (*)    All other components within normal limits  BASIC METABOLIC PANEL - Abnormal; Notable for the following components:   CO2 21 (*)    Glucose, Bld 110 (*)    All other components within normal limits  URINE CULTURE  CBC WITH DIFFERENTIAL/PLATELET     EKG     RADIOLOGY  I reviewed and interpreted patient CT of the abdomen pelvis which is concerning for a bladder mass  PROCEDURES:  Critical Care performed: No  Procedures  MEDICATIONS ORDERED IN ED: Medications  iohexol (OMNIPAQUE) 300 MG/ML solution 100 mL (100 mLs Intravenous Contrast Given 09/22/22 1444)     IMPRESSION / MDM / ASSESSMENT AND PLAN / ED COURSE  I reviewed the triage vital signs and the nursing notes.                              Patient's presentation is most consistent with acute complicated illness / injury requiring diagnostic workup.  Differential diagnosis  includes, but is not limited to, UTI, nephrolithiasis, RCC, bladder cancer  Patient is a 76 year old female presents with painless hematuria x 5 days.  Tells me her urine is dark red and that blood is throughout the urinary stream.  She has no associated dysuria urgency frequency no associated flank pain or abdominal pain no fevers or chills.  Is hypertensive on arrival vitals are otherwise reassuring she looks well on exam abdominal exam is benign and she has no CVA tenderness.  Urinalysis obtained from triage has greater than 50 red cells, 10 white cells few bacteria.  Differential as above.  My primary concern is for malignancy.  Will obtain CBC and BMP but patient will need urology follow-up which I explained to her.   CT abdomen pelvis hematuria protocol is concerning for bladder mass.  Discussed the likelihood that she has bladder cancer with the patient.  Encouraged urology follow-up.  I did also place a outpatient urology referral.        FINAL CLINICAL IMPRESSION(S) / ED DIAGNOSES   Final diagnoses:  Bladder mass  Hematuria, unspecified type     Rx / DC Orders   ED Discharge Orders          Ordered    Ambulatory referral to Urology        09/22/22 1548             Note:  This document was prepared using Dragon voice recognition software and may include unintentional dictation errors.   Georga Hacking, MD 09/22/22 1547    Georga Hacking, MD 09/22/22 [phone]

## 2022-09-23 LAB — URINE CULTURE: Culture: <10000 — AB

## 2022-09-26 ENCOUNTER — Ambulatory Visit: Payer: Medicare Other | Admitting: Urology

## 2022-09-26 ENCOUNTER — Telehealth: Payer: Self-pay

## 2022-09-26 ENCOUNTER — Other Ambulatory Visit: Payer: Self-pay | Admitting: Urology

## 2022-09-26 ENCOUNTER — Encounter: Payer: Self-pay | Admitting: Urology

## 2022-09-26 ENCOUNTER — Encounter
Admission: RE | Admit: 2022-09-26 | Discharge: 2022-09-26 | Disposition: A | Discharge status: Home / Self Care | Payer: Medicare Other | Source: Ambulatory Visit | Attending: Urology | Admitting: Urology

## 2022-09-26 VITALS — BP 145/65 | HR 73 | Ht 65.0 in | Wt 188.0 lb

## 2022-09-26 DIAGNOSIS — D494 Neoplasm of unspecified behavior of bladder: Secondary | ICD-10-CM

## 2022-09-26 DIAGNOSIS — R310 Gross hematuria: Secondary | ICD-10-CM | POA: Diagnosis not present

## 2022-09-26 HISTORY — DX: Gastro-esophageal reflux disease without esophagitis: K21.9

## 2022-09-26 NOTE — Progress Notes (Signed)
Surgical Physician Order Form [facility] [place]  * Scheduling expectation :  Friday 6/21  *Length of Case: 1 hour  *Clearance needed: no  *Anticoagulation Instructions: Hold all anticoagulants  *Aspirin Instructions: Hold Aspirin  *Post-op visit Date/Instructions:  1-2 week with pathology review  *Diagnosis: Bladder Tumor  *Procedure:     TURBT 2-5cm ([id number])   Additional orders: Gemcitabine 2000mg  bladder instillation  -Admit type: OUTpatient  -Anesthesia: General  -VTE Prophylaxis Standing Order SCD's       Other:   -Standing Lab Orders Per Anesthesia    Lab other: None  -Standing Test orders EKG/Chest x-ray per Anesthesia       Test other:   - Medications:  Ancef 2gm IV  -Other orders:  N/A

## 2022-09-26 NOTE — Patient Instructions (Signed)
Transurethral Resection of Bladder Tumor  Transurethral resection of a bladder tumor is the removal (resection) of cancerous tissue (tumor) from the inside wall of the bladder. The bladder is the organ that holds urine. The tumor is removed through the tube that carries urine out of the body (urethra). In a transurethral resection, a thin telescope with a light, a tiny camera, and an electric cutting edge (resectoscope) is passed through the urethra. In men, the opening of the urethra is at the end of the penis. In women, it is just above the opening of the vagina. Tell a health care provider about: Any allergies you have. All medicines you are taking, including vitamins, herbs, eye drops, creams, and over-the-counter medicines. Any problems you or family members have had with anesthetic medicines. Any bleeding problems you have. Any surgeries you have had. Any medical conditions you have, including recent urinary tract infections. Whether you are pregnant or may be pregnant. What are the risks? Generally, this is a safe procedure. However, problems may occur, including: Infection. Bleeding. Allergic reactions to medicines. Damage to nearby structures or organs. Difficulty urinating from blockage of the urethra or not being able to urinate (urinary retention). Deep vein thrombosis. This is a blood clot that can develop in your leg. Recurring cancer. What happens before the procedure? When to stop eating and drinking Follow instructions from your health care provider about what you may eat and drink before your procedure. These may include: 8 hours before your procedure Stop eating most foods. Do not eat meat, fried foods, or fatty foods. Eat only light foods, such as toast or crackers. All liquids are okay except energy drinks and alcohol. 6 hours before your procedure Stop eating. Drink only clear liquids, such as water, clear fruit juice, black coffee, plain tea, and sports  drinks. Do not drink energy drinks or alcohol. 2 hours before your procedure Stop drinking all liquids. You may be allowed to take medicines with small sips of water. Medicines Ask your health care provider about: Changing or stopping your regular medicines. This is especially important if you are taking diabetes medicines or blood thinners. Taking medicines such as aspirin and ibuprofen. These medicines can thin your blood. Do not take these medicines unless your health care provider tells you to take them. Taking over-the-counter medicines, vitamins, herbs, and supplements. General instructions If you will be going home right after the procedure, plan to have a responsible adult: Take you home from the hospital or clinic. You will not be allowed to drive. Care for you for the time you are told. Ask your health care provider what steps will be taken to help prevent infection. These steps may include: Washing skin with a germ-killing soap. Taking antibiotic medicine. Do not use any products that contain nicotine or tobacco for at least 4 weeks before the procedure. These products include cigarettes, chewing tobacco, and vaping devices, such as e-cigarettes. If you need help quitting, ask your health care provider. What happens during the procedure? An IV will be inserted into one of your veins. You will be given one or more of the following: A medicine to help you relax (sedative). A medicine that is injected into your spine to numb the area below and slightly above the injection site (spinal anesthetic). A medicine that is injected into an area of your body to numb everything below the injection site (regional anesthetic). A medicine to make you fall asleep (general anesthetic). Your legs will be placed in foot rests (  stirrups) to open your legs and bend your knees. The resectoscope will be passed through your urethra and into your bladder. The part of your bladder with the tumor will be  resected by the cutting edge of the resectoscope. Fluid will be passed to rinse out the cut tissues (irrigation). The resectoscope will then be taken out. A small, thin tube (catheter) will be passed through your urethra and into your bladder. The catheter will drain urine into a bag outside of your body. The procedure may vary among health care providers and hospitals. What happens after the procedure? Your blood pressure, heart rate, breathing rate, and blood oxygen level will be monitored until you leave the hospital or clinic. You may continue to receive fluids and medicines through an IV. You will be given pain medicine to relieve pain. You will have a catheter to drain your urine. The amount of urine will be measured. If you have blood in your urine, your bladder may be rinsed out by passing fluid through your catheter. You will be encouraged to walk as soon as you can. You may have to wear compression stockings. These stockings help to prevent blood clots and reduce swelling in your legs. If you were given a sedative during the procedure, it can affect you for several hours. Do not drive or operate machinery until your health care provider says that it is safe. Summary Transurethral resection of a bladder tumor is the removal (resection) of a cancerous growth (tumor) on the inside wall of the bladder. To do this procedure, your health care provider uses a thin telescope with a light, a tiny camera, and an electric cutting edge (resectoscope) that is guided to your bladder through your urethra. The part of your bladder that is affected by the tumor will be resected by the cutting edge of the resectoscope. A catheter will be passed through your urethra and into your bladder. The catheter will drain urine into a bag outside of your body. If you will be going home right after the procedure, plan to have a responsible adult take you home from the hospital or clinic. You will not be allowed to  drive. This information is not intended to replace advice given to you by your health care provider. Make sure you discuss any questions you have with your health care provider. Document Revised: 03/31/2021 Document Reviewed: 03/31/2021 Elsevier Patient Education  2024 [employer]  Transurethral Resection of Bladder Tumor, Care After The following information offers guidance on how to care for yourself after your procedure. Your health care provider may also give you more specific instructions. If you have problems or questions, contact your health care provider. What can I expect after the procedure? After the procedure, it is common to have: A small amount of blood or small blood clots in your urine for up to 2 weeks. Soreness or mild pain from your catheter. After your catheter is removed, you may have mild soreness, especially when urinating. A need to urinate often. Pain in your lower abdomen. Follow these instructions at home: Medicines  Take over-the-counter and prescription medicines only as told by your health care provider. If you were prescribed an antibiotic medicine, take it as told by your health care provider. Do not stop taking the antibiotic even if you start to feel better. Ask your health care provider if the medicine prescribed to you: Requires you to avoid driving or using machinery. Can cause constipation. You may need to take these actions to prevent  or treat constipation: Drink enough fluid to keep your urine pale yellow. Take over-the-counter or prescription medicines. Eat foods that are high in fiber, such as beans, whole grains, and fresh fruits and vegetables. Limit foods that are high in fat and processed sugars, such as fried or sweet foods. Activity  If you were given a sedative during the procedure, it can affect you for several hours. Do not drive or operate machinery until your health care provider says that it is safe. Rest as told by your health care  provider. Avoid sitting for a long time without moving. Get up to take short walks every 1-2 hours. This is important to improve blood flow and breathing. Ask for help if you feel weak or unsteady. Do not lift anything that is heavier than 10 lb (4.5 kg), or the limit that you are told, until your health care provider says that it is safe. Avoid intense physical activity for as long as told by your health care provider. Do not have sex until your health care provider approves. Return to your normal activities as told by your health care provider. Ask your health care provider what activities are safe for you. General instructions If you have a catheter, follow instructions from your health care provider about caring for your catheter and your drainage bag. Do not drink alcohol for as long as told by your health care provider. This is especially important if you are taking prescription pain medicines. Do not use any products that contain nicotine or tobacco. These products include cigarettes, chewing tobacco, and vaping devices, such as e-cigarettes. If you need help quitting, ask your health care provider. Wear compression stockings as told by your health care provider. These stockings help to prevent blood clots and reduce swelling in your legs. Keep all follow-up visits. This is important. You will need to be followed closely with regular checks of your bladder and urethra (cystoscopies) to make sure that the cancer does not come back. Contact a health care provider if: You have blood in your urine for more than 2 weeks. You become constipated. Signs of constipation may include: Having fewer than three bowel movements in a week. Difficulty having a bowel movement. Stools that are dry, hard, or larger than normal. You have a urinary catheter in place, and you have: Spasms or pain. Problems with your catheter or your catheter is blocked. Your catheter has been taken out but you are unable to  urinate. You have signs of infection, such as: Fever or chills. Cloudy or bad-smelling urine. Get help right away if: You have severe abdominal pain that gets worse or does not improve with medicine. You have a lot of large blood clots in your urine. You develop swelling or pain in your leg. You have difficulty breathing. These symptoms may be an emergency. Get help right away. Call 911. Do not wait to see if the symptoms will go away. Do not drive yourself to the hospital. Summary After your procedure, it is common to have a small amount of blood or small blood clots in your urine, soreness or mild pain from your catheter, and pain in your lower abdomen. Take over-the-counter and prescription medicines only as told by your health care provider. Rest as told by your health care provider. Follow your health care provider's instructions about returning to normal activities. Ask what activities are safe for you. If you have a catheter, follow instructions from your health care provider about caring for your catheter and   your drainage bag. This information is not intended to replace advice given to you by your health care provider. Make sure you discuss any questions you have with your health care provider. Document Revised: 03/31/2021 Document Reviewed: 03/31/2021 Elsevier Patient Education  2024 [employer].

## 2022-09-26 NOTE — H&P (View-Only) (Signed)
   09/26/22 2:07 PM   Monica Harrington 09/26/1945 [id number]  CC: Gross hematuria, bladder tumor  HPI: 77-year-old female with history notable for diabetes who reports about a month of intermittent gross hematuria.  This ultimately resulted in ER visit on 09/22/2022 where CT urogram showed a 3 cm bladder mass consistent with bladder cancer.  There was no evidence of metastatic disease or hydronephrosis.  She denies any urinary symptoms or weight loss.  Not currently smoking, quit about 3 years ago.   PMH: Past Medical History:  Diagnosis Date   Diabetes mellitus, type 2 (HCC)    Hyperlipidemia    Hypertension    Osteoarthritis of both shoulders     Surgical History: Past Surgical History:  Procedure Laterality Date   BREAST BIOPSY Left 05/17/2016   neg   COLONOSCOPY WITH PROPOFOL N/A 04/29/2018   Procedure: COLONOSCOPY WITH PROPOFOL;  Surgeon: Toledo, Teodoro K, MD;  Location: [facility] ENDOSCOPY;  Service: Gastroenterology;  Laterality: N/A;   FOOT FRACTURE SURGERY Right 8/08   FRACTURE SURGERY     NO PAST SURGERIES       Family History: Family History  Problem Relation Age of Onset   Alcohol abuse Father    Hypertension Sister    Hypertension Sister    Cancer Neg Hx    Diabetes Neg Hx    Heart disease Neg Hx     Social History:  reports that she quit smoking about 3 years ago. Her smoking use included cigarettes. She has never been exposed to tobacco smoke. She has never used smokeless tobacco. She reports that she does not currently use alcohol. She reports that she does not use drugs.  Physical Exam: BP (!) 145/65 (BP Location: Left Arm, Patient Position: Sitting, Cuff Size: Large)   Pulse 73   Ht 5' 5" (1.651 m)   Wt 188 lb (85.3 kg)   BMI 31.28 kg/m    Constitutional:  Alert and oriented, No acute distress. Cardiovascular: Regular rate and rhythm Respiratory: Normal respiratory effort, no increased work of breathing. GI: Abdomen is soft, nontender,  nondistended, no abdominal masses   Laboratory Data: Reviewed, see epic  Pertinent Imaging: I have personally viewed and interpreted the CT urogram showing a 3 cm bladder mass consistent with bladder cancer, no hydronephrosis or evidence of metastatic disease.  Assessment & Plan:   77-year-old female with painless gross hematuria, CT urogram showing 3 cm bladder tumor, no evidence of metastatic disease.  We discussed transurethral resection of bladder tumor (TURBT) and risks and benefits at length. This is typically a 1 to 2-hour procedure done under general anesthesia in the operating room.  A scope is inserted through the urethra and used to resect abnormal tissue within the bladder, which is then sent to the pathologist to determine grade and stage of the tumor.  Risks include bleeding, infection, need for temporary Foley placement, and bladder perforation.  Treatment strategies are based on the type of tumor and depth of invasion.  We briefly reviewed the different treatment pathways for non-muscle invasive and muscle invasive bladder cancer.  Schedule TURBT with gemcitabine  Alizaya Oshea, MD 09/26/2022  [facility] [address], [address] [place], [place] [address] [phone]   

## 2022-09-26 NOTE — Patient Instructions (Addendum)
Your procedure is scheduled on: Friday, June 21 Report to the Registration Desk on the 1st floor of the [facility] To find out your arrival time, please call [phone] between 1PM - 3PM on: Thursday, June 20 If your arrival time is 6:00 am, do not arrive before that time as the [facility] entrance doors do not open until 6:00 am.  REMEMBER: Instructions that are not followed completely may result in serious medical risk, up to and including death; or upon the discretion of your surgeon and anesthesiologist your surgery may need to be rescheduled.  Do not eat or drink after midnight the night before surgery.  No gum chewing or hard candies.  One week prior to surgery: starting today, June 19 Stop Anti-inflammatories (NSAIDS) such as Advil, Aleve, Ibuprofen, Motrin, Naproxen, Naprosyn and Aspirin based products such as Excedrin, Goody's Powder, BC Powder. Stop ANY OVER THE COUNTER supplements until after surgery. Stop multiple vitamins. You may however, continue to take Tylenol if needed for pain up until the day of surgery.  Continue taking all prescribed medications with the exception of the following:  Metformin - do NOT take any more as of today, Wednesday, June 19. Resume AFTER surgery.  TAKE ONLY THESE MEDICATIONS THE MORNING OF SURGERY WITH A SIP OF WATER:  Atorvastatin (Lipitor) Pantoprazole (Protonix) - (take one the night before and one on the morning of surgery - helps to prevent nausea after surgery.)  No Alcohol for 24 hours before or after surgery.  No Smoking including e-cigarettes for 24 hours before surgery.   On the morning of surgery brush your teeth with toothpaste and water, you may rinse your mouth with mouthwash if you wish. Do not swallow any toothpaste or mouthwash.  Do not wear jewelry, make-up, hairpins, clips or nail polish.  Do not wear lotions, powders, or perfumes.   Do not shave body hair from the neck down 48 hours before  surgery.  Contact lenses, hearing aids and dentures may not be worn into surgery.  Do not bring valuables to the hospital. [facility] is not responsible for any missing/lost belongings or valuables.   Notify your doctor if there is any change in your medical condition (cold, fever, infection).  Wear comfortable clothing (specific to your surgery type) to the hospital.  After surgery, you can help prevent lung complications by doing breathing exercises.  Take deep breaths and cough every 1-2 hours. Your doctor may order a device called an Incentive Spirometer to help you take deep breaths.  If you are being discharged the day of surgery, you will not be allowed to drive home. You will need a responsible individual to drive you home and stay with you for 24 hours after surgery.   If you are taking public transportation, you will need to have a responsible individual with you.  Please call the Pre-admissions Testing Dept. at [phone] if you have any questions about these instructions.  Surgery Visitation Policy:  Patients having surgery or a procedure may have two visitors.  Children under the age of 2 must have an adult with them who is not the patient.

## 2022-09-26 NOTE — Telephone Encounter (Signed)
I spoke with Monica Harrington. We have discussed possible surgery dates and Friday June 21st, 2024 was agreed upon by all parties. Patient given information about surgery date, what to expect pre-operatively and post operatively.  We discussed that a Pre-Admission Testing office will be calling to set up the pre-op visit that will take place prior to surgery, and that these appointments are typically done over the phone with a Pre-Admissions RN. Informed patient that our office will communicate any additional care to be provided after surgery. Patients questions or concerns were discussed during our call. Advised to call our office should there be any additional information, questions or concerns that arise. Patient verbalized understanding.

## 2022-09-26 NOTE — Progress Notes (Signed)
   09/26/22 2:07 PM   Monica Harrington 13-Apr-1945 [id number]  CC: Gross hematuria, bladder tumor  HPI: 77 year old female with history notable for diabetes who reports about a month of intermittent gross hematuria.  This ultimately resulted in ER visit on 09/22/2022 where CT urogram showed a 3 cm bladder mass consistent with bladder cancer.  There was no evidence of metastatic disease or hydronephrosis.  She denies any urinary symptoms or weight loss.  Not currently smoking, quit about 3 years ago.   PMH: Past Medical History:  Diagnosis Date   Diabetes mellitus, type 2 (HCC)    Hyperlipidemia    Hypertension    Osteoarthritis of both shoulders     Surgical History: Past Surgical History:  Procedure Laterality Date   BREAST BIOPSY Left 05/17/2016   neg   COLONOSCOPY WITH PROPOFOL N/A 04/29/2018   Procedure: COLONOSCOPY WITH PROPOFOL;  Surgeon: Toledo, Boykin Nearing, MD;  Location: [facility] ENDOSCOPY;  Service: Gastroenterology;  Laterality: N/A;   FOOT FRACTURE SURGERY Right 8/08   FRACTURE SURGERY     NO PAST SURGERIES       Family History: Family History  Problem Relation Age of Onset   Alcohol abuse Father    Hypertension Sister    Hypertension Sister    Cancer Neg Hx    Diabetes Neg Hx    Heart disease Neg Hx     Social History:  reports that she quit smoking about 3 years ago. Her smoking use included cigarettes. She has never been exposed to tobacco smoke. She has never used smokeless tobacco. She reports that she does not currently use alcohol. She reports that she does not use drugs.  Physical Exam: BP (!) 145/65 (BP Location: Left Arm, Patient Position: Sitting, Cuff Size: Large)   Pulse 73   Ht 5\' 5"  (1.651 m)   Wt 188 lb (85.3 kg)   BMI 31.28 kg/m    Constitutional:  Alert and oriented, No acute distress. Cardiovascular: Regular rate and rhythm Respiratory: Normal respiratory effort, no increased work of breathing. GI: Abdomen is soft, nontender,  nondistended, no abdominal masses   Laboratory Data: Reviewed, see epic  Pertinent Imaging: I have personally viewed and interpreted the CT urogram showing a 3 cm bladder mass consistent with bladder cancer, no hydronephrosis or evidence of metastatic disease.  Assessment & Plan:   77 year old female with painless gross hematuria, CT urogram showing 3 cm bladder tumor, no evidence of metastatic disease.  We discussed transurethral resection of bladder tumor (TURBT) and risks and benefits at length. This is typically a 1 to 2-hour procedure done under general anesthesia in the operating room.  A scope is inserted through the urethra and used to resect abnormal tissue within the bladder, which is then sent to the pathologist to determine grade and stage of the tumor.  Risks include bleeding, infection, need for temporary Foley placement, and bladder perforation.  Treatment strategies are based on the type of tumor and depth of invasion.  We briefly reviewed the different treatment pathways for non-muscle invasive and muscle invasive bladder cancer.  Schedule TURBT with gemcitabine  Legrand Rams, MD 09/26/2022  [facility] [address] [place], [place] [address] [phone]

## 2022-09-26 NOTE — Progress Notes (Signed)
   [facility] Posting Form  Surgery Date: Date: 09/28/2022  Surgeon: Dr. Legrand Rams, MD  Inpt ( No  )   Outpt (Yes)   Obs ( No  )   Diagnosis: D49.4 Bladder Tumor  -CPT: 16109, [phone]  Surgery: Transurethral Resection of Bladder Tumor with Intravesical Instillation of Gemcitabine  Stop Anticoagulations: Yes and hold ASA  Cardiac/Medical/Pulmonary Clearance needed: no  *Orders entered into EPIC  Date: 09/26/22   *Case booked in EPIC  Date: 09/26/22  *Notified pt of Surgery: Date: 09/26/22  PRE-OP UA & CX: no  *Placed into Prior Authorization Work [employer] Date: 09/26/22  Assistant/laser/rep:No

## 2022-09-27 ENCOUNTER — Encounter
Admission: RE | Admit: 2022-09-27 | Discharge: 2022-09-27 | Disposition: A | Discharge status: Home / Self Care | Payer: Medicare Other | Source: Ambulatory Visit | Attending: Urology | Admitting: Urology

## 2022-09-27 DIAGNOSIS — I10 Essential (primary) hypertension: Secondary | ICD-10-CM | POA: Insufficient documentation

## 2022-09-27 DIAGNOSIS — E1159 Type 2 diabetes mellitus with other circulatory complications: Secondary | ICD-10-CM | POA: Diagnosis not present

## 2022-09-27 DIAGNOSIS — Z01810 Encounter for preprocedural cardiovascular examination: Secondary | ICD-10-CM | POA: Insufficient documentation

## 2022-09-27 DIAGNOSIS — Z01812 Encounter for preprocedural laboratory examination: Secondary | ICD-10-CM

## 2022-09-27 MED ORDER — CHLORHEXIDINE GLUCONATE 0.12 % MT SOLN
15.0000 mL | Freq: Once | OROMUCOSAL | Status: AC
  Administered 2022-09-28: 15 mL via OROMUCOSAL

## 2022-09-27 MED ORDER — ORAL CARE MOUTH RINSE: 15.0000 mL | Freq: Once | OROMUCOSAL | Status: AC

## 2022-09-27 MED ORDER — SODIUM CHLORIDE 0.9 % IV SOLN: INTRAVENOUS | Status: DC

## 2022-09-27 MED ORDER — CEFAZOLIN SODIUM-DEXTROSE 2-4 GM/100ML-% IV SOLN
2.0000 g | INTRAVENOUS | Status: AC
  Administered 2022-09-28: 2 g via INTRAVENOUS

## 2022-09-27 MED ORDER — GEMCITABINE CHEMO FOR BLADDER INSTILLATION 2000 MG
2000.0000 mg | Freq: Once | INTRAVENOUS | Status: DC
  Filled 2022-09-27: qty 52.6

## 2022-09-28 ENCOUNTER — Ambulatory Visit: Payer: Medicare Other | Admitting: General Practice

## 2022-09-28 ENCOUNTER — Encounter
Admission: RE | Disposition: A | Discharge status: Home / Self Care | Payer: Self-pay | Source: Home / Self Care | Attending: Urology

## 2022-09-28 ENCOUNTER — Other Ambulatory Visit: Payer: Self-pay

## 2022-09-28 ENCOUNTER — Ambulatory Visit
Admission: RE | Admit: 2022-09-28 | Discharge: 2022-09-28 | Disposition: A | Discharge status: Home / Self Care | Payer: Medicare Other | Attending: Urology | Admitting: Urology

## 2022-09-28 ENCOUNTER — Encounter: Payer: Self-pay | Admitting: Urology

## 2022-09-28 DIAGNOSIS — Z7984 Long term (current) use of oral hypoglycemic drugs: Secondary | ICD-10-CM | POA: Diagnosis not present

## 2022-09-28 DIAGNOSIS — I10 Essential (primary) hypertension: Secondary | ICD-10-CM | POA: Diagnosis not present

## 2022-09-28 DIAGNOSIS — K219 Gastro-esophageal reflux disease without esophagitis: Secondary | ICD-10-CM | POA: Diagnosis not present

## 2022-09-28 DIAGNOSIS — C672 Malignant neoplasm of lateral wall of bladder: Secondary | ICD-10-CM | POA: Diagnosis not present

## 2022-09-28 DIAGNOSIS — C678 Malignant neoplasm of overlapping sites of bladder: Secondary | ICD-10-CM

## 2022-09-28 DIAGNOSIS — E1159 Type 2 diabetes mellitus with other circulatory complications: Secondary | ICD-10-CM

## 2022-09-28 DIAGNOSIS — E785 Hyperlipidemia, unspecified: Secondary | ICD-10-CM | POA: Insufficient documentation

## 2022-09-28 DIAGNOSIS — M1990 Unspecified osteoarthritis, unspecified site: Secondary | ICD-10-CM | POA: Diagnosis not present

## 2022-09-28 DIAGNOSIS — D494 Neoplasm of unspecified behavior of bladder: Secondary | ICD-10-CM | POA: Diagnosis not present

## 2022-09-28 DIAGNOSIS — Z87891 Personal history of nicotine dependence: Secondary | ICD-10-CM | POA: Insufficient documentation

## 2022-09-28 DIAGNOSIS — E119 Type 2 diabetes mellitus without complications: Secondary | ICD-10-CM | POA: Insufficient documentation

## 2022-09-28 DIAGNOSIS — Z01812 Encounter for preprocedural laboratory examination: Secondary | ICD-10-CM

## 2022-09-28 HISTORY — PX: BLADDER INSTILLATION: SHX6893

## 2022-09-28 HISTORY — PX: TRANSURETHRAL RESECTION OF BLADDER TUMOR: SHX2575

## 2022-09-28 LAB — GLUCOSE, CAPILLARY
Glucose-Capillary: 151 mg/dL — ABNORMAL HIGH (ref 70–99)
Glucose-Capillary: 146 mg/dL — ABNORMAL HIGH (ref 70–99)

## 2022-09-28 SURGERY — TURBT (TRANSURETHRAL RESECTION OF BLADDER TUMOR)
Anesthesia: General

## 2022-09-28 MED ORDER — ROCURONIUM BROMIDE 100 MG/10ML IV SOLN
INTRAVENOUS | Status: DC | PRN
  Administered 2022-09-28: 50 mg via INTRAVENOUS

## 2022-09-28 MED ORDER — GEMCITABINE CHEMO FOR BLADDER INSTILLATION 2000 MG
INTRAVENOUS | Status: DC | PRN
  Administered 2022-09-28: 2000 mg via INTRAVESICAL

## 2022-09-28 MED ORDER — ONDANSETRON HCL 4 MG/2ML IJ SOLN
INTRAMUSCULAR | Status: AC
  Filled 2022-09-28: qty 2

## 2022-09-28 MED ORDER — FENTANYL CITRATE (PF) 100 MCG/2ML IJ SOLN
INTRAMUSCULAR | Status: AC
  Filled 2022-09-28: qty 2

## 2022-09-28 MED ORDER — OXYCODONE HCL 5 MG PO TABS: 5.0000 mg | ORAL_TABLET | Freq: Once | ORAL | Status: DC | PRN

## 2022-09-28 MED ORDER — DEXAMETHASONE SODIUM PHOSPHATE 10 MG/ML IJ SOLN
INTRAMUSCULAR | Status: DC | PRN
  Administered 2022-09-28: 5 mg via INTRAVENOUS

## 2022-09-28 MED ORDER — CEFAZOLIN SODIUM-DEXTROSE 2-4 GM/100ML-% IV SOLN
INTRAVENOUS | Status: AC
  Filled 2022-09-28: qty 100

## 2022-09-28 MED ORDER — OXYCODONE HCL 5 MG/5ML PO SOLN: 5.0000 mg | Freq: Once | ORAL | Status: DC | PRN

## 2022-09-28 MED ORDER — HYDROCODONE-ACETAMINOPHEN 5-325 MG PO TABS: 1.0000 | ORAL_TABLET | Freq: Four times a day (QID) | ORAL | 0 refills | Status: AC | PRN

## 2022-09-28 MED ORDER — FENTANYL CITRATE (PF) 100 MCG/2ML IJ SOLN: 25.0000 ug | INTRAMUSCULAR | Status: DC | PRN

## 2022-09-28 MED ORDER — ROCURONIUM BROMIDE 10 MG/ML (PF) SYRINGE
PREFILLED_SYRINGE | INTRAVENOUS | Status: AC
  Filled 2022-09-28: qty 10

## 2022-09-28 MED ORDER — FENTANYL CITRATE (PF) 100 MCG/2ML IJ SOLN
INTRAMUSCULAR | Status: DC | PRN
  Administered 2022-09-28 (×2): 25 ug via INTRAVENOUS
  Administered 2022-09-28: 50 ug via INTRAVENOUS

## 2022-09-28 MED ORDER — PROPOFOL 10 MG/ML IV BOLUS
INTRAVENOUS | Status: DC | PRN
  Administered 2022-09-28: 130 mg via INTRAVENOUS

## 2022-09-28 MED ORDER — SUGAMMADEX SODIUM 200 MG/2ML IV SOLN
INTRAVENOUS | Status: DC | PRN
  Administered 2022-09-28: 200 mg via INTRAVENOUS

## 2022-09-28 MED ORDER — LIDOCAINE HCL (CARDIAC) PF 100 MG/5ML IV SOSY
PREFILLED_SYRINGE | INTRAVENOUS | Status: DC | PRN
  Administered 2022-09-28: 60 mg via INTRAVENOUS

## 2022-09-28 MED ORDER — CHLORHEXIDINE GLUCONATE 0.12 % MT SOLN
OROMUCOSAL | Status: AC
  Filled 2022-09-28: qty 15

## 2022-09-28 MED ORDER — SODIUM CHLORIDE 0.9 % IR SOLN
Status: DC | PRN
  Administered 2022-09-28 (×2): 3000 mL via INTRAVESICAL

## 2022-09-28 MED ORDER — DEXAMETHASONE SODIUM PHOSPHATE 10 MG/ML IJ SOLN
INTRAMUSCULAR | Status: AC
  Filled 2022-09-28: qty 1

## 2022-09-28 MED ORDER — ONDANSETRON HCL 4 MG/2ML IJ SOLN
INTRAMUSCULAR | Status: DC | PRN
  Administered 2022-09-28: 4 mg via INTRAVENOUS

## 2022-09-28 SURGICAL SUPPLY — 25 items
BAG DRAIN SIEMENS DORNER NS (MISCELLANEOUS) ×1 IMPLANT
BAG DRN NS LF (MISCELLANEOUS) ×1
BAG DRN RND TRDRP ANRFLXCHMBR (UROLOGICAL SUPPLIES) ×1
BAG URINE DRAIN 2000ML AR STRL (UROLOGICAL SUPPLIES) ×1 IMPLANT
BRUSH SCRUB EZ 1% IODOPHOR (MISCELLANEOUS) IMPLANT
CATH FOL 2WAY LX 18X30 (CATHETERS) ×1 IMPLANT
DRAPE UTILITY 15X26 TOWEL STRL (DRAPES) ×1 IMPLANT
DRSG TELFA 3X4 N-ADH STERILE (GAUZE/BANDAGES/DRESSINGS) ×1 IMPLANT
ELECT LOOP 22F BIPOLAR SML (ELECTROSURGICAL)
ELECTRODE LOOP 22F BIPOLAR SML (ELECTROSURGICAL) IMPLANT
GLOVE BIOGEL PI IND STRL 7.5 (GLOVE) ×1 IMPLANT
GOWN STRL REUS W/ TWL LRG LVL3 (GOWN DISPOSABLE) ×1 IMPLANT
GOWN STRL REUS W/ TWL XL LVL3 (GOWN DISPOSABLE) ×1 IMPLANT
GOWN STRL REUS W/TWL LRG LVL3 (GOWN DISPOSABLE) ×1
GOWN STRL REUS W/TWL XL LVL3 (GOWN DISPOSABLE) ×1
IV NS IRRIG 3000ML ARTHROMATIC (IV SOLUTION) ×2 IMPLANT
KIT TURNOVER CYSTO (KITS) ×1 IMPLANT
LOOP CUT BIPOLAR 24F LRG (ELECTROSURGICAL) IMPLANT
PACK CYSTO AR (MISCELLANEOUS) ×1 IMPLANT
PAD ARMBOARD 7.5X6 YLW CONV (MISCELLANEOUS) ×1 IMPLANT
SET IRRIG Y TYPE TUR BLADDER L (SET/KITS/TRAYS/PACK) ×1 IMPLANT
SURGILUBE 2OZ TUBE FLIPTOP (MISCELLANEOUS) ×1 IMPLANT
SYR TOOMEY IRRIG 70ML (MISCELLANEOUS) ×1
SYRINGE TOOMEY IRRIG 70ML (MISCELLANEOUS) ×1 IMPLANT
WATER STERILE IRR 500ML POUR (IV SOLUTION) ×1 IMPLANT

## 2022-09-28 NOTE — Transfer of Care (Signed)
Immediate Anesthesia Transfer of Care Note  Patient: Monica Harrington  Procedure(s) Performed: TRANSURETHRAL RESECTION OF BLADDER TUMOR (TURBT) BLADDER INSTILLATION OF GEMCITABINE  Patient Location: PACU  Anesthesia Type:General  Level of Consciousness: drowsy  Airway & Oxygen Therapy: Patient Spontanous Breathing and Patient connected to face mask oxygen  Post-op Assessment: Report given to RN and Post -op Vital signs reviewed and stable  Post vital signs: Reviewed and stable  Last Vitals:  Vitals Value Taken Time  BP 173/82 09/28/22 1442  Temp    Pulse 82 09/28/22 1445  Resp 15 09/28/22 1445  SpO2 99 % 09/28/22 1445  Vitals shown include unvalidated device data.  Last Pain:  Vitals:   09/28/22 1133  TempSrc: Temporal  PainSc: 0-No pain         Complications: No notable events documented.

## 2022-09-28 NOTE — Anesthesia Preprocedure Evaluation (Signed)
Anesthesia Evaluation  Patient identified by MRN, date of birth, ID band Patient awake    Reviewed: Allergy & Precautions, H&P , NPO status , Patient's Chart, lab work & pertinent test results  History of Anesthesia Complications Negative for: history of anesthetic complications  Airway Mallampati: III  TM Distance: >3 FB Neck ROM: limited    Dental  (+) Chipped, Poor Dentition, Missing, Dental Advidsory Given   Pulmonary neg shortness of breath, COPD, Patient abstained from smoking., former smoker          Cardiovascular Exercise Tolerance: Good hypertension, (-) angina (-) Past MI and (-) DOE      Neuro/Psych negative neurological ROS  negative psych ROS   GI/Hepatic Neg liver ROS,GERD  Medicated and Controlled,,  Endo/Other  diabetes, Type 2, Oral Hypoglycemic Agents    Renal/GU negative Renal ROS  negative genitourinary   Musculoskeletal  (+) Arthritis ,    Abdominal   Peds  Hematology negative hematology ROS (+)   Anesthesia Other Findings Past Medical History: No date: Diabetes mellitus, type 2 (HCC) No date: Hyperlipidemia No date: Hypertension No date: Osteoarthritis of both shoulders  Past Surgical History: 05/17/2016: BREAST BIOPSY; Left     Comment:  neg 8/08: FOOT FRACTURE SURGERY; Right No date: FRACTURE SURGERY No date: NO PAST SURGERIES  BMI    Body Mass Index:  25.50 kg/m      Reproductive/Obstetrics negative OB ROS                             Anesthesia Physical Anesthesia Plan  ASA: 3  Anesthesia Plan: General   Post-op Pain Management:    Induction: Intravenous  PONV Risk Score and Plan: 3 and Propofol infusion, TIVA and Ondansetron  Airway Management Planned: Natural Airway and Nasal Cannula  Additional Equipment:   Intra-op Plan:   Post-operative Plan:   Informed Consent: I have reviewed the patients History and Physical, chart, labs and  discussed the procedure including the risks, benefits and alternatives for the proposed anesthesia with the patient or authorized representative who has indicated his/her understanding and acceptance.     Dental Advisory Given  Plan Discussed with: Anesthesiologist, CRNA and Surgeon  Anesthesia Plan Comments: (Patient consented for risks of anesthesia including but not limited to:  - adverse reactions to medications - risk of intubation if required - damage to teeth, lips or other oral mucosa - sore throat or hoarseness - Damage to heart, brain, lungs or loss of life  Patient voiced understanding.)        Anesthesia Quick Evaluation

## 2022-09-28 NOTE — Op Note (Signed)
Date of procedure: 09/28/22  Preoperative diagnosis:  Bladder tumor  Postoperative diagnosis:  Same  Procedure: Transurethral resection of bladder tumor(TURBT), ~3cm Intravesical instillation of gemcitabine  Surgeon: Legrand Rams, MD  Anesthesia: General  Complications: None  Intraoperative findings:  Large 3 cm spherical papillary bladder tumor at the left lateral wall 1 cm lateral to the left ureteral orifice, no other bladder tumors Ureteral orifices intact after resection, excellent hemostasis, all visible tumor resected  EBL: Minimal  Specimens:  Bladder tumor superficial Bladder tumor deep  Drains: 18 French two-way Foley, 10 mill in balloon  Indication: Monica Harrington is a 77 y.o. patient with gross hematuria is found of a 3 cm bladder tumor on CT and opted for TURBT.  After reviewing the management options for treatment, they elected to proceed with the above surgical procedure(s). We have discussed the potential benefits and risks of the procedure, side effects of the proposed treatment, the likelihood of the patient achieving the goals of the procedure, and any potential problems that might occur during the procedure or recuperation. Informed consent has been obtained.  Description of procedure:  The patient was taken to the operating room and general anesthesia was induced. SCDs were placed for DVT prophylaxis. The patient was placed in the dorsal lithotomy position, prepped and draped in the usual sterile fashion, and preoperative antibiotics were administered. A preoperative time-out was performed.   A 26 French resectoscope was inserted into the urethra and thorough cystoscopy was performed.  There was a 3 cm papillary spherical tumor at the left lateral wall approximately 1 cm lateral to the left ureteral orifice.  No other suspicious bladder lesions.    The large bipolar resecting loop was used to methodically resect the tumor down to the bladder wall.  The  tumor was quite vascular.  Superficial tumor was sent for pathology.  The resecting loop was then used to very carefully resect down to the level of muscle, there was no evidence of bladder perforation.  The left ureteral orifice was intact after resection.  Meticulous hemostasis achieved.  Deep bladder tumor was sent separately for pathology.  With the bladder decompressed there was no evidence of bleeding, no residual tumor.  An 57 French Foley passed easily to the bladder with return of clear fluid, 10ml placed in the balloon.  Bladder was irrigated with 500 mL sterile water.  The bladder was drained, and 2g/85mL gemcitabine was instilled into the bladder and the catheter clamped.  Disposition: Stable to PACU  Plan: Unclamp Foley at 3:30 PM and allow gemcitabine to drain Foley can be removed early next week in clinic on Monday or Tuesday Keep 2-week follow-up to discuss pathology  Legrand Rams, MD

## 2022-09-28 NOTE — Progress Notes (Signed)
Unclamped foley and drained bladder per order.  Changed foley bag.  Pt tolerated well.

## 2022-09-28 NOTE — Anesthesia Procedure Notes (Signed)
Procedure Name: Intubation Date/Time: 09/28/2022 1:50 PM  Performed by: Monico Hoar, CRNAPre-anesthesia Checklist: Patient identified, Patient being monitored, Timeout performed, Emergency Drugs available and Suction available Patient Re-evaluated:Patient Re-evaluated prior to induction Oxygen Delivery Method: Circle system utilized Preoxygenation: Pre-oxygenation with 100% oxygen Induction Type: IV induction Ventilation: Mask ventilation without difficulty Laryngoscope Size: 3 and McGraph Grade View: Grade I Tube type: Oral Tube size: 7.0 mm Number of attempts: 1 Airway Equipment and Method: Stylet Placement Confirmation: ETT inserted through vocal cords under direct vision, positive ETCO2 and breath sounds checked- equal and bilateral Secured at: 21 cm Tube secured with: Tape Dental Injury: Teeth and Oropharynx as per pre-operative assessment

## 2022-09-28 NOTE — Interval H&P Note (Signed)
UROLOGY H&P UPDATE  Agree with prior H&P dated 09/26/2022, 3 cm bladder tumor gross hematuria, here today for TURBT.  Cardiac: RRR Lungs: CTA bilaterally  Laterality: n/a Procedure: Transurethral resection of bladder tumor, gemcitabine  Urine: Culture 6/15 no growth  We discussed transurethral resection of bladder tumor (TURBT) and risks and benefits at length. This is typically a 1 to 2-hour procedure done under general anesthesia in the operating room.  A scope is inserted through the urethra and used to resect abnormal tissue within the bladder, which is then sent to the pathologist to determine grade and stage of the tumor.  Risks include bleeding, infection, need for temporary Foley placement, and bladder perforation.  Treatment strategies are based on the type of tumor and depth of invasion.  We briefly reviewed the different treatment pathways for non-muscle invasive and muscle invasive bladder cancer.   Sondra Come, MD 09/28/2022

## 2022-09-28 NOTE — Discharge Instructions (Signed)

## 2022-09-30 NOTE — Anesthesia Postprocedure Evaluation (Signed)
Anesthesia Post Note  Patient: Tremaine Fuhriman Faught  Procedure(s) Performed: TRANSURETHRAL RESECTION OF BLADDER TUMOR (TURBT) BLADDER INSTILLATION OF GEMCITABINE  Patient location during evaluation: PACU Anesthesia Type: General Level of consciousness: awake and alert Pain management: pain level controlled Vital Signs Assessment: post-procedure vital signs reviewed and stable Respiratory status: spontaneous breathing, nonlabored ventilation, respiratory function stable and patient connected to nasal cannula oxygen Cardiovascular status: blood pressure returned to baseline and stable Postop Assessment: no apparent nausea or vomiting Anesthetic complications: no   No notable events documented.   Last Vitals:  Vitals:   09/28/22 1537 09/28/22 1559  BP: (!) 153/96 (!) 153/68  Pulse: 71 68  Resp: 14 18  Temp: (!) 36.1 C (!) 36.1 C  SpO2: 95% 95%    Last Pain:  Vitals:   09/29/22 1145  TempSrc:   PainSc: 0-No pain                 Cleda Mccreedy Bolden Hagerman

## 2022-10-01 ENCOUNTER — Encounter: Payer: Self-pay | Admitting: Urology

## 2022-10-01 ENCOUNTER — Ambulatory Visit (INDEPENDENT_AMBULATORY_CARE_PROVIDER_SITE_OTHER): Payer: Medicare Other | Admitting: Physician Assistant

## 2022-10-01 DIAGNOSIS — D494 Neoplasm of unspecified behavior of bladder: Secondary | ICD-10-CM

## 2022-10-01 NOTE — Progress Notes (Signed)
Catheter Removal  Patient is present today for a catheter removal.  9 ml of water was drained from the balloon. A 18 FR foley cath was removed from the bladder, no complications were noted. Patient tolerated well.  Performed by: Kiah Keay H RMA  Follow up/ Additional notes: n/a

## 2022-10-11 ENCOUNTER — Other Ambulatory Visit: Payer: Self-pay | Admitting: Internal Medicine

## 2022-10-16 ENCOUNTER — Encounter: Payer: Self-pay | Admitting: Urology

## 2022-10-18 ENCOUNTER — Ambulatory Visit: Payer: Medicare Other | Admitting: Urology

## 2022-10-18 ENCOUNTER — Encounter: Payer: Self-pay | Admitting: Urology

## 2022-10-18 VITALS — BP 135/70 | HR 76

## 2022-10-18 DIAGNOSIS — C672 Malignant neoplasm of lateral wall of bladder: Secondary | ICD-10-CM | POA: Diagnosis not present

## 2022-10-18 NOTE — Patient Instructions (Signed)

## 2022-10-18 NOTE — Addendum Note (Signed)
Addended by: Sondra Come on: 10/18/2022 12:44 PM   Modules accepted: Orders

## 2022-10-18 NOTE — Progress Notes (Signed)
   10/18/2022 12:39 PM   Monica Harrington 1946/02/13 [id number]  Reason for visit: Follow up TURBT pathology, new diagnosis of bladder cancer  HPI: 77 year old female who presented with gross hematuria and was found to have a 3 cm bladder mass on CT.  On 09/28/2022 she underwent TURBT with removal of all visible tumor, with postop gemcitabine.  Foley was removed 3 days later.  She has done well since surgery, denies any urinary symptoms, no gross hematuria.  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  Definitive of bladder detrusor muscle was not present in the specimen.  We reviewed her new diagnosis of bladder cancer at length, as of now staging is HG T1.  Per the AUA guidelines, I recommended a repeat bladder biopsy/TURBT in 4 to 6 weeks from date of original procedure to evaluate for any residual microscopic disease, and confirm no muscle invasion.  Risks and benefits discussed at length.  We also reviewed additional treatments in the future if confirmed to have nonmuscle invasive disease including BCG and surveillance cystoscopy.  Schedule bladder biopsy and fulguration/second look TURBT   Sondra Come, MD  [facility] [address], [address] [place], [place] [address] [phone]

## 2022-10-22 ENCOUNTER — Telehealth: Payer: Self-pay

## 2022-10-22 ENCOUNTER — Other Ambulatory Visit: Payer: Self-pay

## 2022-10-22 DIAGNOSIS — D494 Neoplasm of unspecified behavior of bladder: Secondary | ICD-10-CM

## 2022-10-22 NOTE — Progress Notes (Signed)
Surgical Physician Order Form [facility]  Dr. Richardo Hanks, MD  * Scheduling expectation : 7/26 or 8/2  *Length of Case: 1 hour  *Clearance needed: no  *Anticoagulation Instructions: Hold all anticoagulants  *Aspirin Instructions: Hold Aspirin  *Post-op visit Date/Instructions:  1-2 week with pathology review  *Diagnosis: Bladder Tumor  *Procedure:  TURBT 2-5cm (16109)   Additional orders: N/A  -Admit type: OUTpatient  -Anesthesia: General  -VTE Prophylaxis Standing Order SCD's       Other:   -Standing Lab Orders Per Anesthesia    Lab other: UA&Urine Culture  -Standing Test orders EKG/Chest x-ray per Anesthesia       Test other:   - Medications:  Ancef 2gm IV  -Other orders:  N/A

## 2022-10-22 NOTE — Progress Notes (Signed)
   [facility] Surgical Posting Form  Surgery Date: Date: 11/02/2022  Surgeon: Dr. Legrand Rams, MD  Inpt ( No  )   Outpt (Yes)   Obs ( No  )   Diagnosis: D49.4 Bladder Tumor  -CPT: 82956  Surgery: Transurethral Resection of Bladder Tumor  Stop Anticoagulations: Yes and also hold ASA  Cardiac/Medical/Pulmonary Clearance needed: no  *Orders entered into EPIC  Date: 10/22/22   *Case booked in [place]  Date: 10/22/22  *Notified pt of Surgery: Date: 10/22/22  PRE-OP UA & CX: yes, will obtain in clinic on 10/23/2022  *Placed into Prior Authorization Work Angela Nevin Date: 10/22/22  Assistant/laser/rep:No

## 2022-10-22 NOTE — Telephone Encounter (Signed)
I spoke with Monica Harrington. We have discussed possible surgery dates and Friday July 26th, 2024 was agreed upon by all parties. Patient given information about surgery date, what to expect pre-operatively and post operatively.  We discussed that a Pre-Admission Testing office will be calling to set up the pre-op visit that will take place prior to surgery, and that these appointments are typically done over the phone with a Pre-Admissions RN. Informed patient that our office will communicate any additional care to be provided after surgery. Patients questions or concerns were discussed during our call. Advised to call our office should there be any additional information, questions or concerns that arise. Patient verbalized understanding.

## 2022-10-23 ENCOUNTER — Other Ambulatory Visit: Payer: Medicare Other

## 2022-10-23 DIAGNOSIS — D494 Neoplasm of unspecified behavior of bladder: Secondary | ICD-10-CM

## 2022-10-23 DIAGNOSIS — C672 Malignant neoplasm of lateral wall of bladder: Secondary | ICD-10-CM | POA: Diagnosis not present

## 2022-10-23 LAB — URINALYSIS, COMPLETE
Bilirubin, UA: NEGATIVE
Glucose, UA: NEGATIVE
Ketones, UA: NEGATIVE
Nitrite, UA: NEGATIVE
Specific Gravity, UA: 1.025 (ref 1.005–1.030)
Urobilinogen, Ur: 0.2 mg/dL (ref 0.2–1.0)
pH, UA: 5.5 (ref 5.0–7.5)

## 2022-10-23 LAB — MICROSCOPIC EXAMINATION: WBC, UA: >30 /hpf — AB (ref 0–5)

## 2022-10-25 ENCOUNTER — Encounter
Admission: RE | Admit: 2022-10-25 | Discharge: 2022-10-25 | Disposition: A | Discharge status: Home / Self Care | Payer: Medicare Other | Source: Ambulatory Visit | Attending: Urology | Admitting: Urology

## 2022-10-25 ENCOUNTER — Other Ambulatory Visit: Payer: Self-pay

## 2022-10-25 DIAGNOSIS — Z01812 Encounter for preprocedural laboratory examination: Secondary | ICD-10-CM

## 2022-10-25 DIAGNOSIS — E1159 Type 2 diabetes mellitus with other circulatory complications: Secondary | ICD-10-CM

## 2022-10-25 HISTORY — DX: Malignant (primary) neoplasm, unspecified: C80.1

## 2022-10-25 NOTE — Patient Instructions (Addendum)
Your procedure is scheduled on: 11/02/22 - Friday Report to the Registration Desk on the 1st floor of the [facility]. To find out your arrival time, please call [phone] between 1PM - 3PM on: 11/01/22 - Thursday If your arrival time is 6:00 am, do not arrive before that time as the [facility] entrance doors do not open until 6:00 am.  REMEMBER: Instructions that are not followed completely may result in serious medical risk, up to and including death; or upon the discretion of your surgeon and anesthesiologist your surgery may need to be rescheduled.  Do not eat food or drink any liquids after midnight the night before surgery.  No gum chewing or hard candies.   One week prior to surgery 10/26/22:  Stop Anti-inflammatories (NSAIDS) such as Advil, Aleve, Ibuprofen, Motrin, Naproxen, Naprosyn and Aspirin based products such as Excedrin, Goody's Powder, BC Powder. You may however, continue to take Tylenol if needed for pain up until the day of surgery.  Stop taking beginning 10/26/22, ANY OVER THE COUNTER supplements until after surgery :Multiple Vitamins-Minerals      TAKE ONLY THESE MEDICATIONS THE MORNING OF SURGERY WITH A SIP OF WATER:  pantoprazole (PROTONIX) - (take one the night before and one on the morning of surgery - helps to prevent nausea after surgery.)   HOLD metFORMIN (GLUCOPHAGE) beginning 10/31/22.  No Alcohol for 24 hours before or after surgery.  No Smoking including e-cigarettes for 24 hours before surgery.  No chewable tobacco products for at least 6 hours before surgery.  No nicotine patches on the day of surgery.  Do not use any "recreational" drugs for at least a week (preferably 2 weeks) before your surgery.  Please be advised that the combination of cocaine and anesthesia may have negative outcomes, up to and including death. If you test positive for cocaine, your surgery will be cancelled.  On the morning of surgery brush your teeth with  toothpaste and water, you may rinse your mouth with mouthwash if you wish. Do not swallow any toothpaste or mouthwash.  Use CHG Soap or wipes as directed on instruction sheet.  Do not wear jewelry, make-up, hairpins, clips or nail polish.  Do not wear lotions, powders, or perfumes.   Do not shave body hair from the neck down 48 hours before surgery.  Contact lenses, hearing aids and dentures may not be worn into surgery.  Do not bring valuables to the hospital. [facility] is not responsible for any missing/lost belongings or valuables.   Notify your doctor if there is any change in your medical condition (cold, fever, infection).  Wear comfortable clothing (specific to your surgery type) to the hospital.  After surgery, you can help prevent lung complications by doing breathing exercises.  Take deep breaths and cough every 1-2 hours. Your doctor may order a device called an Incentive Spirometer to help you take deep breaths. When coughing or sneezing, hold a pillow firmly against your incision with both hands. This is called "splinting." Doing this helps protect your incision. It also decreases belly discomfort.  If you are being admitted to the hospital overnight, leave your suitcase in the car. After surgery it may be brought to your room.  In case of increased patient census, it may be necessary for you, the patient, to continue your postoperative care in the Same Day Surgery department.  If you are being discharged the day of surgery, you will not be allowed to drive home. You will need a responsible  individual to drive you home and stay with you for 24 hours after surgery.   If you are taking public transportation, you will need to have a responsible individual with you.  Please call the Pre-admissions Testing Dept. at [phone] if you have any questions about these instructions.  Surgery Visitation Policy:  Patients having surgery or a procedure may have two visitors.   Children under the age of 44 must have an adult with them who is not the patient.  Inpatient Visitation:    Visiting hours are 7 a.m. to 8 p.m. Up to four visitors are allowed at one time in a patient room. The visitors may rotate out with other people during the day.  One visitor age 69 or older may stay with the patient overnight and must be in the room by 8 p.m.

## 2022-10-27 LAB — CULTURE, URINE COMPREHENSIVE

## 2022-10-29 LAB — CULTURE, URINE COMPREHENSIVE

## 2022-10-30 ENCOUNTER — Telehealth: Payer: Self-pay

## 2022-10-30 MED ORDER — NITROFURANTOIN MONOHYD MACRO 100 MG PO CAPS: 100.0000 mg | ORAL_CAPSULE | Freq: Two times a day (BID) | ORAL | 0 refills | Status: DC

## 2022-10-30 NOTE — Telephone Encounter (Signed)
-----   Message from Sondra Come sent at 10/30/2022  7:41 AM EDT ----- Please start nitrofurantoin 100 mg twice daily x 7 days to sterilize urine prior to upcoming surgery  Legrand Rams, MD 10/30/2022

## 2022-10-30 NOTE — Telephone Encounter (Signed)
Spoke with pt. Pt. Advised of results and verbalized understanding. Medication sent to [facility] per Patient request.

## 2022-11-02 ENCOUNTER — Encounter: Payer: Self-pay | Admitting: Urology

## 2022-11-02 ENCOUNTER — Ambulatory Visit: Payer: Medicare Other | Admitting: Certified Registered"

## 2022-11-02 ENCOUNTER — Other Ambulatory Visit: Payer: Self-pay

## 2022-11-02 ENCOUNTER — Ambulatory Visit
Admission: RE | Admit: 2022-11-02 | Discharge: 2022-11-02 | Disposition: A | Discharge status: Home / Self Care | Payer: Medicare Other | Source: Home / Self Care | Attending: Urology | Admitting: Urology

## 2022-11-02 ENCOUNTER — Encounter
Admission: RE | Disposition: A | Discharge status: Home / Self Care | Payer: Self-pay | Source: Home / Self Care | Attending: Urology

## 2022-11-02 DIAGNOSIS — E785 Hyperlipidemia, unspecified: Secondary | ICD-10-CM | POA: Diagnosis not present

## 2022-11-02 DIAGNOSIS — E119 Type 2 diabetes mellitus without complications: Secondary | ICD-10-CM | POA: Insufficient documentation

## 2022-11-02 DIAGNOSIS — D494 Neoplasm of unspecified behavior of bladder: Secondary | ICD-10-CM | POA: Diagnosis not present

## 2022-11-02 DIAGNOSIS — I10 Essential (primary) hypertension: Secondary | ICD-10-CM | POA: Diagnosis not present

## 2022-11-02 DIAGNOSIS — Z01812 Encounter for preprocedural laboratory examination: Secondary | ICD-10-CM

## 2022-11-02 DIAGNOSIS — K219 Gastro-esophageal reflux disease without esophagitis: Secondary | ICD-10-CM | POA: Insufficient documentation

## 2022-11-02 DIAGNOSIS — R310 Gross hematuria: Secondary | ICD-10-CM | POA: Insufficient documentation

## 2022-11-02 DIAGNOSIS — Z87891 Personal history of nicotine dependence: Secondary | ICD-10-CM | POA: Diagnosis not present

## 2022-11-02 DIAGNOSIS — Z8551 Personal history of malignant neoplasm of bladder: Secondary | ICD-10-CM | POA: Diagnosis not present

## 2022-11-02 DIAGNOSIS — E1159 Type 2 diabetes mellitus with other circulatory complications: Secondary | ICD-10-CM

## 2022-11-02 DIAGNOSIS — C679 Malignant neoplasm of bladder, unspecified: Secondary | ICD-10-CM | POA: Diagnosis not present

## 2022-11-02 HISTORY — PX: TRANSURETHRAL RESECTION OF BLADDER TUMOR: SHX2575

## 2022-11-02 LAB — GLUCOSE, CAPILLARY
Glucose-Capillary: 161 mg/dL — ABNORMAL HIGH (ref 70–99)
Glucose-Capillary: 166 mg/dL — ABNORMAL HIGH (ref 70–99)

## 2022-11-02 SURGERY — TURBT (TRANSURETHRAL RESECTION OF BLADDER TUMOR)
Anesthesia: General | Site: Bladder

## 2022-11-02 MED ORDER — CHLORHEXIDINE GLUCONATE 0.12 % MT SOLN
15.0000 mL | Freq: Once | OROMUCOSAL | Status: AC
  Administered 2022-11-02: 15 mL via OROMUCOSAL

## 2022-11-02 MED ORDER — PROPOFOL 10 MG/ML IV BOLUS
INTRAVENOUS | Status: DC | PRN
Start: 2022-11-02 — End: 2022-11-02
  Administered 2022-11-02: 150 mg via INTRAVENOUS

## 2022-11-02 MED ORDER — FENTANYL CITRATE (PF) 100 MCG/2ML IJ SOLN
INTRAMUSCULAR | Status: AC
  Filled 2022-11-02: qty 2

## 2022-11-02 MED ORDER — CEFAZOLIN SODIUM-DEXTROSE 2-4 GM/100ML-% IV SOLN
INTRAVENOUS | Status: AC
  Filled 2022-11-02: qty 100

## 2022-11-02 MED ORDER — FENTANYL CITRATE (PF) 100 MCG/2ML IJ SOLN: 25.0000 ug | INTRAMUSCULAR | Status: DC | PRN

## 2022-11-02 MED ORDER — CHLORHEXIDINE GLUCONATE 0.12 % MT SOLN
OROMUCOSAL | Status: AC
  Filled 2022-11-02: qty 15

## 2022-11-02 MED ORDER — ACETAMINOPHEN 10 MG/ML IV SOLN
INTRAVENOUS | Status: DC | PRN
  Administered 2022-11-02: 1000 mg via INTRAVENOUS

## 2022-11-02 MED ORDER — ONDANSETRON HCL 4 MG/2ML IJ SOLN
INTRAMUSCULAR | Status: DC | PRN
  Administered 2022-11-02: 4 mg via INTRAVENOUS

## 2022-11-02 MED ORDER — ORAL CARE MOUTH RINSE: 15.0000 mL | Freq: Once | OROMUCOSAL | Status: AC

## 2022-11-02 MED ORDER — CEFAZOLIN SODIUM-DEXTROSE 2-4 GM/100ML-% IV SOLN
2.0000 g | INTRAVENOUS | Status: AC
  Administered 2022-11-02: 2 g via INTRAVENOUS

## 2022-11-02 MED ORDER — FENTANYL CITRATE (PF) 100 MCG/2ML IJ SOLN
INTRAMUSCULAR | Status: DC | PRN
  Administered 2022-11-02 (×2): 50 ug via INTRAVENOUS

## 2022-11-02 MED ORDER — STERILE WATER FOR IRRIGATION IR SOLN
Status: DC | PRN
  Administered 2022-11-02 (×2): 3000 mL

## 2022-11-02 MED ORDER — OXYCODONE HCL 5 MG/5ML PO SOLN: 5.0000 mg | Freq: Once | ORAL | Status: DC | PRN

## 2022-11-02 MED ORDER — DEXAMETHASONE SODIUM PHOSPHATE 10 MG/ML IJ SOLN
INTRAMUSCULAR | Status: DC | PRN
  Administered 2022-11-02: 5 mg via INTRAVENOUS

## 2022-11-02 MED ORDER — OXYCODONE HCL 5 MG PO TABS: 5.0000 mg | ORAL_TABLET | Freq: Once | ORAL | Status: DC | PRN

## 2022-11-02 MED ORDER — SODIUM CHLORIDE 0.9 % IV SOLN: INTRAVENOUS | Status: DC

## 2022-11-02 MED ORDER — ROCURONIUM BROMIDE 100 MG/10ML IV SOLN
INTRAVENOUS | Status: DC | PRN
  Administered 2022-11-02: 50 mg via INTRAVENOUS

## 2022-11-02 MED ORDER — LIDOCAINE HCL (CARDIAC) PF 100 MG/5ML IV SOSY
PREFILLED_SYRINGE | INTRAVENOUS | Status: DC | PRN
  Administered 2022-11-02: 50 mg via INTRAVENOUS

## 2022-11-02 MED ORDER — SUGAMMADEX SODIUM 200 MG/2ML IV SOLN
INTRAVENOUS | Status: DC | PRN
  Administered 2022-11-02: 400 mg via INTRAVENOUS

## 2022-11-02 SURGICAL SUPPLY — 29 items
BAG DRAIN SIEMENS DORNER NS (MISCELLANEOUS) ×1 IMPLANT
BAG DRN NS LF (MISCELLANEOUS) ×1
BAG DRN RND TRDRP ANRFLXCHMBR (UROLOGICAL SUPPLIES) ×1
BAG URINE DRAIN 2000ML AR STRL (UROLOGICAL SUPPLIES) ×1 IMPLANT
BRUSH SCRUB EZ 4% CHG (MISCELLANEOUS) ×1 IMPLANT
CATH FOL 2WAY LX 18X30 (CATHETERS) ×1 IMPLANT
DRAPE UTILITY 15X26 TOWEL STRL (DRAPES) ×1 IMPLANT
DRSG TELFA 3X4 N-ADH STERILE (GAUZE/BANDAGES/DRESSINGS) ×1 IMPLANT
ELECT LOOP 22F BIPOLAR SML (ELECTROSURGICAL)
ELECT REM PT RETURN 9FT ADLT (ELECTROSURGICAL)
ELECTRODE LOOP 22F BIPOLAR SML (ELECTROSURGICAL) IMPLANT
ELECTRODE REM PT RTRN 9FT ADLT (ELECTROSURGICAL) IMPLANT
GLOVE BIOGEL PI IND STRL 7.5 (GLOVE) ×1 IMPLANT
GOWN STRL REUS W/ TWL LRG LVL3 (GOWN DISPOSABLE) ×1 IMPLANT
GOWN STRL REUS W/ TWL XL LVL3 (GOWN DISPOSABLE) ×1 IMPLANT
GOWN STRL REUS W/TWL LRG LVL3 (GOWN DISPOSABLE) ×1
GOWN STRL REUS W/TWL XL LVL3 (GOWN DISPOSABLE) ×1
KIT TURNOVER CYSTO (KITS) ×1 IMPLANT
LOOP CUT BIPOLAR 24F LRG (ELECTROSURGICAL) IMPLANT
NDL SAFETY ECLIP 18X1.5 (MISCELLANEOUS) ×1 IMPLANT
PACK CYSTO AR (MISCELLANEOUS) ×1 IMPLANT
PAD ARMBOARD 7.5X6 YLW CONV (MISCELLANEOUS) ×1 IMPLANT
SET IRRIG Y TYPE TUR BLADDER L (SET/KITS/TRAYS/PACK) ×1 IMPLANT
SURGILUBE 2OZ TUBE FLIPTOP (MISCELLANEOUS) ×1 IMPLANT
SYR TOOMEY IRRIG 70ML (MISCELLANEOUS) ×1
SYRINGE TOOMEY IRRIG 70ML (MISCELLANEOUS) ×1 IMPLANT
WATER STERILE IRR 1000ML POUR (IV SOLUTION) ×1 IMPLANT
WATER STERILE IRR 3000ML UROMA (IV SOLUTION) IMPLANT
WATER STERILE IRR 500ML POUR (IV SOLUTION) ×1 IMPLANT

## 2022-11-02 NOTE — H&P (Signed)
11/02/22 8:42 AM   Lonni Peyton Bottoms 04-16-1945 [id number]  CC: Bladder cancer  HPI: 77 year old female who presented with gross hematuria and was found to have a 3 cm bladder mass on CT.  On 09/28/2022 she underwent TURBT with removal of all visible tumor, with postop gemcitabine.  Foley was removed 3 days later.     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  Definitive of bladder detrusor muscle was not present in the specimen.   We reviewed her new diagnosis of bladder cancer at length, as of now staging is HG T1.  Per the AUA guidelines, I recommended a repeat bladder biopsy/TURBT in 4 to 6 weeks from date of original procedure to evaluate for any residual microscopic disease, and confirm no muscle invasion.  Risks and benefits discussed at length.  We also reviewed additional treatments in the future if confirmed to have nonmuscle invasive disease including BCG and surveillance cystoscopy.   PMH: Past Medical History:  Diagnosis Date   Bladder tumor 09/2022   Cancer [facility])    Diabetes mellitus, type 2 (HCC)    GERD (gastroesophageal reflux disease)    Hyperlipidemia    Hypertension    Osteoarthritis of both shoulders     Surgical History: Past Surgical History:  Procedure Laterality Date   BLADDER INSTILLATION N/A 09/28/2022   Procedure: BLADDER INSTILLATION OF GEMCITABINE;  Surgeon: Sondra Come, MD;  Location: [facility] ORS;  Service: Urology;  Laterality: N/A;   BREAST BIOPSY Left 05/17/2016   neg   COLONOSCOPY WITH PROPOFOL N/A 04/29/2018   Procedure: COLONOSCOPY WITH PROPOFOL;  Surgeon: Toledo, Boykin Nearing, MD;  Location: [facility] ENDOSCOPY;  Service: Gastroenterology;  Laterality: N/A;   FOOT FRACTURE SURGERY Right 11/2006   TRANSURETHRAL RESECTION OF BLADDER TUMOR N/A 09/28/2022   Procedure: TRANSURETHRAL RESECTION OF BLADDER TUMOR (TURBT);   Surgeon: Sondra Come, MD;  Location: [facility] ORS;  Service: Urology;  Laterality: N/A;    Family History: Family History  Problem Relation Age of Onset   Alcohol abuse Father    Hypertension Sister    Hypertension Sister    Cancer Neg Hx    Diabetes Neg Hx    Heart disease Neg Hx     Social History:  reports that she quit smoking about 18 years ago. Her smoking use included cigarettes. She has never been exposed to tobacco smoke. She has never used smokeless tobacco. She reports that she does not currently use alcohol. She reports that she does not use drugs.  Physical Exam: BP (!) 153/74   Pulse 68   Temp (!) 97.4 F (36.3 C) (Temporal)   Resp 16   SpO2 96%    Constitutional:  Alert and oriented, No acute distress. Cardiovascular: Regular rate and rhythm Respiratory: Clear to auscultation bilaterally GI: Abdomen is soft, nontender, nondistended, no abdominal masses   Laboratory Data: Urine culture 7/16 with 50-100k Enterococcus, treated with nitrofurantoin  Assessment & Plan:   77 year old female with HG T1 urothelial cell carcinoma, here for second look bladder biopsy/TURBT.  We discussed transurethral resection of bladder tumor (TURBT) and risks and benefits at length. This is typically a 1 to 2-hour procedure done under general anesthesia in the operating room.  A scope is inserted through the urethra and used to resect abnormal tissue within the bladder, which is then sent to the pathologist to determine grade and stage of the tumor.  Risks include bleeding, infection, need for temporary Foley placement, and bladder perforation.  Treatment strategies are based on the type of tumor and depth of invasion.  We briefly reviewed the different treatment pathways for non-muscle invasive and muscle invasive bladder cancer.  Second look bladder biopsy/TURBT today  Legrand Rams, MD 11/02/2022  [facility] [address], [address] [place], [place] [address] [phone]

## 2022-11-02 NOTE — Anesthesia Preprocedure Evaluation (Signed)
Anesthesia Evaluation  Patient identified by MRN, date of birth, ID band Patient awake    Reviewed: Allergy & Precautions, H&P , NPO status , Patient's Chart, lab work & pertinent test results  History of Anesthesia Complications Negative for: history of anesthetic complications  Airway Mallampati: III  TM Distance: >3 FB Neck ROM: limited    Dental  (+) Chipped, Poor Dentition, Missing, Dental Advidsory Given   Pulmonary neg shortness of breath, COPD, Patient abstained from smoking., former smoker          Cardiovascular Exercise Tolerance: Good hypertension, (-) angina (-) Past MI and (-) DOE      Neuro/Psych negative neurological ROS  negative psych ROS   GI/Hepatic Neg liver ROS,GERD  Medicated and Controlled,,  Endo/Other  diabetes, Type 2, Oral Hypoglycemic Agents    Renal/GU      Musculoskeletal   Abdominal   Peds  Hematology negative hematology ROS (+)   Anesthesia Other Findings Past Medical History: No date: Diabetes mellitus, type 2 (HCC) No date: Hyperlipidemia No date: Hypertension No date: Osteoarthritis of both shoulders  Past Surgical History: 05/17/2016: BREAST BIOPSY; Left     Comment:  neg 8/08: FOOT FRACTURE SURGERY; Right No date: FRACTURE SURGERY No date: NO PAST SURGERIES  BMI    Body Mass Index:  25.50 kg/m      Reproductive/Obstetrics negative OB ROS                             Anesthesia Physical Anesthesia Plan  ASA: 3  Anesthesia Plan: General ETT and General   Post-op Pain Management:    Induction: Intravenous  PONV Risk Score and Plan: 4 or greater and Midazolam, Ondansetron and Dexamethasone  Airway Management Planned: Oral ETT  Additional Equipment:   Intra-op Plan:   Post-operative Plan: Extubation in OR  Informed Consent: I have reviewed the patients History and Physical, chart, labs and discussed the procedure including the  risks, benefits and alternatives for the proposed anesthesia with the patient or authorized representative who has indicated his/her understanding and acceptance.     Dental Advisory Given  Plan Discussed with: Anesthesiologist, CRNA and Surgeon  Anesthesia Plan Comments: (Patient consented for risks of anesthesia including but not limited to:  - adverse reactions to medications - damage to eyes, teeth, lips or other oral mucosa - nerve damage due to positioning  - sore throat or hoarseness - Damage to heart, brain, nerves, lungs, other parts of body or loss of life  Patient voiced understanding.)       Anesthesia Quick Evaluation

## 2022-11-02 NOTE — Op Note (Signed)
Date of procedure: 11/02/22  Preoperative diagnosis:  Bladder cancer  Postoperative diagnosis:  Same  Procedure: Bladder biopsy and fulguration, 2.5 cm  Surgeon: Legrand Rams, MD  Anesthesia: General  Complications: None  Intraoperative findings:  No visible tumor remained 2.5 cm scar/tumor base of the left lateral wall was resected entirely with cold cup biopsy forceps and fulgurated Excellent hemostasis, ureteral orifices intact bilaterally  EBL: Minimal  Specimens: Bladder biopsy  Drains: None  Indication: Monica Harrington is a 77 y.o. 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  After reviewing the management options for treatment, they elected to proceed with the above surgical procedure(s). We have discussed the potential benefits and risks of the procedure, side effects of the proposed treatment, the likelihood of the patient achieving the goals of the procedure, and any potential problems that might occur during the procedure or recuperation. Informed consent has been obtained.  Description of procedure:  The patient was taken to the operating room and general anesthesia was induced. SCDs were placed for DVT prophylaxis. The patient was placed in the dorsal lithotomy position, prepped and draped in the usual sterile fashion, and preoperative antibiotics(Ancef) were administered. A preoperative time-out was performed.   A 21 French rigid cystoscope was used to intubate the urethra and thorough cystoscopy was performed.  There was a 2.5 cm area of scar/base of prior bladder tumor at the left lateral wall just adjacent to the left ureteral orifice.  There was some erythema, but no definitive papillary recurrence.  The cold cup biopsy forceps were used to take multiple samples of all abnormal appearing tissue at the left lateral wall down to muscle.  The  Bugbee was used for meticulous hemostasis, with the bladder decompressed no bleeding was noted, left ureteral orifice remained intact after fulguration.  Disposition: Stable to PACU  Plan: Follow-up in clinic in 1 to 2 weeks to discuss pathology results  Legrand Rams, MD

## 2022-11-02 NOTE — Anesthesia Postprocedure Evaluation (Signed)
Anesthesia Post Note  Patient: Monica Harrington  Procedure(s) Performed: TRANSURETHRAL RESECTION OF BLADDER TUMOR (TURBT) (Bladder)  Patient location during evaluation: PACU Anesthesia Type: General Level of consciousness: awake and alert Pain management: pain level controlled Vital Signs Assessment: post-procedure vital signs reviewed and stable Respiratory status: spontaneous breathing, nonlabored ventilation, respiratory function stable and patient connected to nasal cannula oxygen Cardiovascular status: blood pressure returned to baseline and stable Postop Assessment: no apparent nausea or vomiting Anesthetic complications: no  No notable events documented.   Last Vitals:  Vitals:   11/02/22 1000 11/02/22 1017  BP: 135/69 (!) 142/85  Pulse: 65 73  Resp: 11 15  Temp: (!) 36.2 C (!) 36.1 C  SpO2: 95% 94%    Last Pain:  Vitals:   11/02/22 1017  TempSrc: Temporal  PainSc: 0-No pain                 Stephanie Coup

## 2022-11-02 NOTE — Discharge Instructions (Signed)
AMBULATORY SURGERY  DISCHARGE INSTRUCTIONS   The drugs that you were given will stay in your system until tomorrow so for the next 24 hours you should not:  Drive an automobile Make any legal decisions Drink any alcoholic beverage   You may resume regular meals tomorrow.  Today it is better to start with liquids and gradually work up to solid foods.  You may eat anything you prefer, but it is better to start with liquids, then soup and crackers, and gradually work up to solid foods.   Please notify your doctor immediately if you have any unusual bleeding, trouble breathing, redness and pain at the surgery site, drainage, fever, or pain not relieved by medication.    Additional Instructions: 

## 2022-11-02 NOTE — Anesthesia Procedure Notes (Signed)
Procedure Name: Intubation Date/Time: 11/02/2022 9:07 AM  Performed by: Cheral Bay, CRNAPre-anesthesia Checklist: Patient identified, Emergency Drugs available, Suction available and Patient being monitored Patient Re-evaluated:Patient Re-evaluated prior to induction Oxygen Delivery Method: Circle system utilized Preoxygenation: Pre-oxygenation with 100% oxygen Induction Type: IV induction Ventilation: Mask ventilation without difficulty Laryngoscope Size: McGraph and 3 Grade View: Grade I Tube type: Oral Tube size: 7.0 mm Number of attempts: 1 Airway Equipment and Method: Stylet, Oral airway and Cricothyrotomy Placement Confirmation: ETT inserted through vocal cords under direct vision, positive ETCO2 and breath sounds checked- equal and bilateral Secured at: 20 cm Tube secured with: Tape Dental Injury: Teeth and Oropharynx as per pre-operative assessment

## 2022-11-02 NOTE — Transfer of Care (Signed)
Immediate Anesthesia Transfer of Care Note  Patient: Monica Harrington  Procedure(s) Performed: TRANSURETHRAL RESECTION OF BLADDER TUMOR (TURBT) (Bladder)  Patient Location: PACU  Anesthesia Type:General  Level of Consciousness: awake  Airway & Oxygen Therapy: Patient Spontanous Breathing and Patient connected to face mask oxygen  Post-op Assessment: Report given to RN and Post -op Vital signs reviewed and stable  Post vital signs: Reviewed and stable  Last Vitals:  Vitals Value Taken Time  BP 161/87 11/02/22 0940  Temp 36.2 C 11/02/22 0940  Pulse 71 11/02/22 0942  Resp 15 11/02/22 0942  SpO2 100 % 11/02/22 0942  Vitals shown include unfiled device data.  Last Pain:  Vitals:   11/02/22 0940  TempSrc:   PainSc: Asleep         Complications: No notable events documented.

## 2022-11-08 ENCOUNTER — Encounter: Payer: Self-pay | Admitting: Urology

## 2022-11-08 ENCOUNTER — Ambulatory Visit (INDEPENDENT_AMBULATORY_CARE_PROVIDER_SITE_OTHER): Payer: Medicare Other | Admitting: Urology

## 2022-11-08 ENCOUNTER — Ambulatory Visit: Payer: Medicare Other | Admitting: Urology

## 2022-11-08 VITALS — BP 138/77 | HR 73 | Ht 65.0 in | Wt 188.0 lb

## 2022-11-08 DIAGNOSIS — C672 Malignant neoplasm of lateral wall of bladder: Secondary | ICD-10-CM | POA: Diagnosis not present

## 2022-11-08 NOTE — Progress Notes (Signed)
   11/08/2022 4:03 PM   Monica Harrington [birth year] [id number]  Reason for visit: Follow up bladder cancer, repeat bladder biopsy  HPI: 77 year old female who originally presented in June 2024 with gross hematuria that was found of a 3 cm bladder tumor on CT.  She underwent initial TURBT on 09/28/2022 with removal of all visible tumor, postop gemcitabine was given.  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  She underwent second look bladder biopsy on 11/02/2022 and pathology was benign, muscle was present with no evidence of malignancy noted.  We reviewed her pathology results today, as well as AUA guidelines regarding nonmuscle invasive bladder cancer.  I recommended at the very least induction BCG x 6 with close surveillance cystoscopy every 3 to 4 months for the first 2 years, and CT scan every 1 to 2 years.  Risks and benefits of BCG were discussed and patient handout provided.  If she tolerates BCG well, likely would benefit from at least 1 year of maintenance.  She is a non-smoker, we reviewed risk of recurrence.  Start induction BCG x 6 end of August 2024, followed by cystoscopy in 3 to 4 months.  If tolerates BCG well, would recommend at least 1 year of maintenance BCG  Sondra Come, MD  [facility] [address] [place], [place] [address] [phone]

## 2022-11-08 NOTE — Patient Instructions (Signed)
Bacillus Calmette-Guerin Live, BCG intravesical solution What is this medication? BACILLUS CALMETTE-GUERIN LIVE, BCG (ba SIL [place]  KAL met  gay RAYN) is a bacteria solution. This medicine stimulates the immune system to ward off cancer cells. It is used to treat bladder cancer. This medicine may be used for other purposes; ask your health care provider or pharmacist if you have questions. This medicine may be used for other purposes; ask your health care provider or pharmacist if you have questions. COMMON BRAND NAME(S): Theracys, TICE BCG What should I tell my care team before I take this medication? They need to know if you have any of these conditions: aneurysm blood in the urine bladder biopsy within 2 weeks fever or infection immune system problems leukemia lymphoma myasthenia gravis need organ transplant prosthetic device like arterial graft, artificial joint, prosthetic heart valve recent or ongoing radiation therapy tuberculosis an unusual or allergic reaction to Bacillus Calmette-Guerin Live, BCG, latex, other medicines, foods, dyes, or preservatives pregnant or trying to get pregnant breast-feeding How should I use this medication? This drug is given as a catheter infusion into the bladder. It is administered in a hospital or clinic by a specially trained health care provider. You will be given directions to follow before the treatment. Follow your health care provider's directions carefully. This medicine contains live bacteria. It is very important to follow these directions closely after treatment to prevent others from coming in contact with your urine. Your health care provider may give you additional directions to follow. Try to hold this medicine in your bladder for 1 to 2 hours. Follow these directions the first time you go to the bathroom and for 6 hours after the first void. Wash your hands before using the restroom. After voiding, wash your hands and genital area. Use a  toilet and sit when going to the bathroom. This helps to prevent the urine from splashing. Do not use public toilets or void outside. After each void, add 2 cups of undiluted bleach to the toilet. Close the lid. Wait 15 to 20 minutes and then flush the toilet. After the first void, drink more fluids to help dilute your urine. If you have urinary incontinence, wash the clothes you were wearing in the washer immediately. Do not wash other clothes at the same time. If you are wearing an incontinence pad, pour bleach on the pad and allow it to soak into the pad before throwing it away. Put the pad in a plastic bag and put it in the trash. Talk to your pediatrician regarding the use of this medicine in children. Special care may be needed. Overdosage: If you think you have taken too much of this medicine contact a poison control center or emergency room at once. NOTE: This medicine is only for you. Do not share this medicine with others. Overdosage: If you think you have taken too much of this medicine contact a poison control center or emergency room at once. NOTE: This medicine is only for you. Do not share this medicine with others. What if I miss a dose? It is important not to miss your dose. Call your doctor or health care professional if you are unable to keep an appointment. What may interact with this medication? antibiotics medicines to suppress your immune system like chemotherapy agents or corticosteroids medicine to treat tuberculosis This list may not describe all possible interactions. Give your health care provider a list of all the medicines, herbs, non-prescription drugs, or dietary supplements you use.  Also tell them if you smoke, drink alcohol, or use illegal drugs. Some items may interact with your medicine. This list may not describe all possible interactions. Give your health care provider a list of all the medicines, herbs, non-prescription drugs, or dietary supplements you use.  Also tell them if you smoke, drink alcohol, or use illegal drugs. Some items may interact with your medicine. What should I watch for while using this medication? Visit your health care provider for checks on your progress. This medicine may make you feel generally unwell. Contact your health care provider if your symptoms last more than 2 days or if they get worse. Call your health care provider right away if you have a severe or unusual symptom. Infection can be spread to others through contact with this medicine. To prevent the spread of infection, follow your health care provider's directions carefully after treatment. Do not become pregnant while taking this medicine. There is a potential for serious side effects to an unborn child. Talk to your health care provider for more information. Do not breast-feed an infant while taking this medicine. If you have sex while on this medicine, use a condom. Ask your health care provider how long you should use a condom. What side effects may I notice from receiving this medication? Side effects that you should report to your doctor or health care professional as soon as possible: allergic reactions like skin rash, itching or hives, swelling of the face, lips, or tongue signs of infection - fever or chills, cough, sore throat, pain or difficulty passing urine signs of decreased red blood cells - unusually weak or tired, fainting spells, lightheadedness blood in urine breathing problems cough eye pain, redness flu-like symptoms joint pain bladder-area pain for more than 2 days after treatment trouble passing urine or change in the amount of urine vomiting yellowing of the eyes or skin Side effects that usually do not require medical attention (report to your doctor or health care professional if they continue or are bothersome): bladder spasm burning when passing urine within 2 days of treatment feel need to pass urine often or wake up at night to  pass urine loss of appetite This list may not describe all possible side effects. Call your doctor for medical advice about side effects. You may report side effects to FDA at 1-800-FDA-1088. This list may not describe all possible side effects. Call your doctor for medical advice about side effects. You may report side effects to FDA at 1-800-FDA-1088. Where should I keep my medication? This drug is given in a hospital or clinic and will not be stored at home. NOTE: This sheet is a summary. It may not cover all possible information. If you have questions about this medicine, talk to your doctor, pharmacist, or health care provider.  2024 Elsevier/Gold Standard (2018-05-30 00:00:00)

## 2022-11-22 ENCOUNTER — Ambulatory Visit: Payer: Medicare Other | Admitting: Urology

## 2022-12-18 ENCOUNTER — Encounter: Payer: Self-pay | Admitting: Physician Assistant

## 2022-12-18 ENCOUNTER — Ambulatory Visit: Payer: Medicare Other | Admitting: Physician Assistant

## 2022-12-18 VITALS — BP 160/66 | HR 72 | Ht 65.0 in | Wt 188.0 lb

## 2022-12-18 DIAGNOSIS — C672 Malignant neoplasm of lateral wall of bladder: Secondary | ICD-10-CM | POA: Diagnosis not present

## 2022-12-18 LAB — URINALYSIS, COMPLETE
Bilirubin, UA: NEGATIVE
Glucose, UA: NEGATIVE
Ketones, UA: NEGATIVE
Leukocytes,UA: NEGATIVE
Nitrite, UA: NEGATIVE
Protein,UA: NEGATIVE
RBC, UA: NEGATIVE
Specific Gravity, UA: 1.01 (ref 1.005–1.030)
Urobilinogen, Ur: 0.2 mg/dL (ref 0.2–1.0)
pH, UA: 5 (ref 5.0–7.5)

## 2022-12-18 LAB — MICROSCOPIC EXAMINATION: Bacteria, UA: NONE SEEN

## 2022-12-18 MED ORDER — BCG LIVE 50 MG IS SUSR
3.2400 mL | Freq: Once | INTRAVESICAL | Status: AC
Start: 2022-12-18 — End: 2022-12-18
  Administered 2022-12-18: 81 mg via INTRAVESICAL

## 2022-12-18 NOTE — Patient Instructions (Signed)
Your Timeline for Today:  Right now through 1:40pm: Hold your urine and do your quarter turns every 15 minutes. 1:40pm-7:40pm today: Every time you urinate, pour 1/2 cup of bleach into the toilet and let it sit for 15 minutes prior to flushing. 7:40pm onward: Resume your normal routine.   Patient Education: (BCG) Into the Bladder (Intravesical Chemotherapy)  BCG is a vaccine which is used to prevent tuberculosis (TB).  But it's also a helpful treatment for some early bladder cancers.  When BCG goes directly into the bladder the treatment is described as intravesical.  BCG is a type of immunotherapy.  Immunotherapy stimulates the body's immune system to destroy cancer cells.  How it's given BCG treatment is given to you in an outpatient setting.  It takes a few minutes to administer and you can go home as soon as it's finished.  It might be a good idea to ask someone to bring you, particularly the fist time.  Unlike chemotherapy into the bladder, BCG treatment is never given immediately after surgery to remove bladder tumors.  There needs to be a delay usually of at least two weeks after surgery, before you can have it.  You won't be given treatment with BCG if you are unwell or have an infection in your urine.  You're usually asked to limit the amount you drink before your treatment.  This will help to increase the concentration of BCG in your bladder.  Drinking too much before your treatment may make your bladder feel uncomfortably full.  If you normally take water tablets (diuretics) take them later in the day after your treatment.  Your nurse or doctor will give you more advise about preparing for your treatment.  You will have a small tube (catheter) placed into your bladder.  Your doctor will then put the liquid vaccine directly into your bladder through the catheter and remove the catheter.  You will need to hold your urine for two hours afterwards.  Rotating every 15 minutes from side to  side. This can be difficult but it's to give the treatment time to work.  When the treatment is over you can go to the toilet.  After your treatment there are some precautions you'll need to take.  This is because BCG is a live vaccine and other people shouldn't be exposed to it.  For the next six hours, you'll need to avoid your urine splashing on the toilet seat and getting any urine on your hands.  It might be easer for men to sit down when they're using an ordinary toilet although using a stand up urinal should be alright.  The main this is to avoid splashing urine and spreading the vaccine.  You will also be asked to put 1/2 cup undiluted bleach into the toilet to destroy any live vaccine and leave it for 15 minutes until you flush for the next 6 voids.  Side Effects Because BCG goes directly into the bladder most of the side effects are linked with the bladder.  They usually go away within one to two days after your treatment.  The most common ones are: -needing to pass urine often -pain when you pass urine -blood in urine -flu-like symptoms (tiredness, general aching and raised temperature)  Theses side effects should settle down within a day or two.  If they don't get better contact your doctor.  Drinking lots of fluids can help flush the drug out of your bladder and reduce some of these effects.  Taking Ibuprofen or Aleve is encouraged unless you have a condition that would make these medications unsafe to take (renal failure, diabetes, gerd)  Rare side effects can include a continuing high temperature (fever), pain in your joints and a cough.  If you have any of these symptoms, or if you feel generally unwell, contact your doctor.  These symptoms could be a sign of a more serious infection (due to BCG) that needs to be treated immediately.  If this happens you'll be treated with the same drugs (antibiotics) that are used to treat TB.  Contraception Men should use a condom during sex for  the first 48 hours after their treatment.  If you are a women who has had BCG treatment then your partner should use a condom.  Using a condom will protect your partner from any vaccine present in your semen or vaginal fluid.  We don't know how BCG may affect a developing fetus so it's not advisable to become pregnant or father a child while having it.  It is important to use effective contraception during your treatment and for six weeks afterwards.  You can discuss this with your doctor or specialist nurse.

## 2022-12-18 NOTE — Progress Notes (Signed)
BCG Bladder Instillation  BCG # 1 of 6  Due to Bladder Cancer patient is present today for a BCG treatment. Patient was cleaned and prepped in a sterile fashion with betadine. A 14FR catheter was inserted, urine return was noted , urine was clear in color.  50ml of reconstituted BCG was instilled into the bladder. The catheter was then removed. Patient tolerated well, no complications were noted  Performed by: Carman Ching, PA-C and Humberta Magallon-Mariche, CMA  Additional notes: Patient remained in clinic for 30 minutes following instillation today for monitoring of hypersensitivity reaction; none noted.  We reviewed post-instillation instructions including holding the urine for 2 hours with quarter turns every 15 minutes and pouring bleach into the toilet with subsequent voids for 6 hours. Written instructions also provided today. She expressed understanding.   Follow up: 1 week for BCG #2 of 6

## 2022-12-25 ENCOUNTER — Ambulatory Visit: Payer: Medicare Other | Admitting: Physician Assistant

## 2023-01-01 ENCOUNTER — Ambulatory Visit: Payer: Medicare Other | Admitting: Physician Assistant

## 2023-01-01 ENCOUNTER — Encounter: Payer: Self-pay | Admitting: Physician Assistant

## 2023-01-01 VITALS — BP 157/74 | HR 69

## 2023-01-01 DIAGNOSIS — C672 Malignant neoplasm of lateral wall of bladder: Secondary | ICD-10-CM

## 2023-01-01 LAB — URINALYSIS, COMPLETE
Bilirubin, UA: NEGATIVE
Glucose, UA: NEGATIVE
Ketones, UA: NEGATIVE
Leukocytes,UA: NEGATIVE
Nitrite, UA: NEGATIVE
Protein,UA: NEGATIVE
RBC, UA: NEGATIVE
Specific Gravity, UA: 1.02 (ref 1.005–1.030)
Urobilinogen, Ur: 0.2 mg/dL (ref 0.2–1.0)
pH, UA: 6 (ref 5.0–7.5)

## 2023-01-01 LAB — MICROSCOPIC EXAMINATION

## 2023-01-01 MED ORDER — BCG LIVE 50 MG IS SUSR
3.2400 mL | Freq: Once | INTRAVESICAL | Status: AC
Start: 2023-01-01 — End: 2023-01-01
  Administered 2023-01-01: 81 mg via INTRAVESICAL

## 2023-01-01 NOTE — Progress Notes (Signed)
BCG Bladder Instillation  BCG # 2 of 6  Due to Bladder Cancer patient is present today for a BCG treatment. Patient was cleaned and prepped in a sterile fashion with betadine. A 14FR catheter was inserted, urine return was noted , urine was yellow in color.  50ml of reconstituted BCG was instilled into the bladder. The catheter was then removed. Patient tolerated well, no complications were noted  Performed by: Carman Ching, PA-C and Humberta Magallon-Mariche, CMA  Follow up/ Additional notes: 1 week for BCG #3 of 6

## 2023-01-02 ENCOUNTER — Encounter: Payer: Self-pay | Admitting: Urology

## 2023-01-08 ENCOUNTER — Ambulatory Visit: Payer: Medicare Other | Admitting: Physician Assistant

## 2023-01-08 ENCOUNTER — Encounter: Payer: Self-pay | Admitting: Physician Assistant

## 2023-01-08 VITALS — BP 125/75 | HR 77

## 2023-01-08 DIAGNOSIS — C672 Malignant neoplasm of lateral wall of bladder: Secondary | ICD-10-CM | POA: Diagnosis not present

## 2023-01-08 LAB — URINALYSIS, COMPLETE
Bilirubin, UA: NEGATIVE
Glucose, UA: NEGATIVE
Ketones, UA: NEGATIVE
Leukocytes,UA: NEGATIVE
Nitrite, UA: NEGATIVE
Protein,UA: NEGATIVE
RBC, UA: NEGATIVE
Specific Gravity, UA: 1.02 (ref 1.005–1.030)
Urobilinogen, Ur: 0.2 mg/dL (ref 0.2–1.0)
pH, UA: 5 (ref 5.0–7.5)

## 2023-01-08 LAB — MICROSCOPIC EXAMINATION
Bacteria, UA: NONE SEEN
RBC, Urine: NONE SEEN /[HPF] (ref 0–2)

## 2023-01-08 MED ORDER — BCG LIVE 50 MG IS SUSR
3.2400 mL | Freq: Once | INTRAVESICAL | Status: AC
Start: 2023-01-08 — End: 2023-01-08
  Administered 2023-01-08: 81 mg via INTRAVESICAL

## 2023-01-08 NOTE — Progress Notes (Signed)
BCG Bladder Instillation  BCG # 3 of 6  Due to Bladder Cancer patient is present today for a BCG treatment. Patient was cleaned and prepped in a sterile fashion with betadine. A 14FR catheter was inserted, urine return was noted 25ml, urine was yellow in color.  50ml of reconstituted BCG was instilled into the bladder. The catheter was then removed. Patient tolerated well, no complications were noted.  Performed by: Carman Ching, PA-C and Humberta Magallon-Mariche, CMA  Follow up/ Additional notes: 1 week for BCG #4

## 2023-01-15 ENCOUNTER — Encounter: Payer: Self-pay | Admitting: Physician Assistant

## 2023-01-15 ENCOUNTER — Ambulatory Visit: Payer: Medicare Other | Admitting: Physician Assistant

## 2023-01-15 VITALS — BP 135/74 | HR 71

## 2023-01-15 DIAGNOSIS — C672 Malignant neoplasm of lateral wall of bladder: Secondary | ICD-10-CM

## 2023-01-15 LAB — URINALYSIS, COMPLETE
Bilirubin, UA: NEGATIVE
Glucose, UA: NEGATIVE
Ketones, UA: NEGATIVE
Leukocytes,UA: NEGATIVE
Nitrite, UA: NEGATIVE
Protein,UA: NEGATIVE
RBC, UA: NEGATIVE
Specific Gravity, UA: 1.02 (ref 1.005–1.030)
Urobilinogen, Ur: 0.2 mg/dL (ref 0.2–1.0)
pH, UA: 5.5 (ref 5.0–7.5)

## 2023-01-15 LAB — MICROSCOPIC EXAMINATION

## 2023-01-15 MED ORDER — BCG LIVE 50 MG IS SUSR
3.2400 mL | Freq: Once | INTRAVESICAL | Status: AC
Start: 2023-01-15 — End: 2023-01-15
  Administered 2023-01-15: 81 mg via INTRAVESICAL

## 2023-01-15 NOTE — Progress Notes (Signed)
BCG Bladder Instillation  BCG # 4 of 6  Due to Bladder Cancer patient is present today for a BCG treatment. Patient was cleaned and prepped in a sterile fashion with betadine. A 14FR catheter was inserted, urine return was noted 15m, urine was yellow in color.  564mof reconstituted BCG was instilled into the bladder. The catheter was then removed. Patient tolerated well, no complications were noted  Performed by: SaDebroah LoopPA-C and Humberta Magallon-Mariche, CMA  Follow up/ Additional notes: 1 week for BCG #5 of 6

## 2023-01-22 ENCOUNTER — Ambulatory Visit: Payer: Medicare Other | Admitting: Physician Assistant

## 2023-01-22 DIAGNOSIS — D494 Neoplasm of unspecified behavior of bladder: Secondary | ICD-10-CM

## 2023-01-22 DIAGNOSIS — C679 Malignant neoplasm of bladder, unspecified: Secondary | ICD-10-CM

## 2023-01-22 LAB — MICROSCOPIC EXAMINATION

## 2023-01-22 LAB — URINALYSIS, COMPLETE
Bilirubin, UA: NEGATIVE
Glucose, UA: NEGATIVE
Ketones, UA: NEGATIVE
Leukocytes,UA: NEGATIVE
Nitrite, UA: NEGATIVE
Protein,UA: NEGATIVE
RBC, UA: NEGATIVE
Specific Gravity, UA: >1.03 — ABNORMAL HIGH (ref 1.005–1.030)
Urobilinogen, Ur: 0.2 mg/dL (ref 0.2–1.0)
pH, UA: 5 (ref 5.0–7.5)

## 2023-01-22 MED ORDER — BCG LIVE 50 MG IS SUSR
3.2400 mL | Freq: Once | INTRAVESICAL | Status: AC
Start: 2023-01-22 — End: 2023-01-22
  Administered 2023-01-22: 81 mg via INTRAVESICAL

## 2023-01-22 NOTE — Progress Notes (Signed)
BCG Bladder Instillation  BCG # 5 of 6  Due to Bladder Cancer patient is present today for a BCG treatment. Patient was cleaned and prepped in a sterile fashion with betadine. A 14FR catheter was inserted, urine return was noted 15ml, urine was yellow in color.  50ml of reconstituted BCG was instilled into the bladder. The catheter was then removed. Patient tolerated well, no complications were noted  Performed by: Carman Ching, PA-C and Ples Specter, CMA  Follow up/ Additional notes: 1 week for BCG #6 of 6

## 2023-01-29 ENCOUNTER — Ambulatory Visit: Payer: Medicare Other | Admitting: Physician Assistant

## 2023-01-29 ENCOUNTER — Encounter: Payer: Self-pay | Admitting: Physician Assistant

## 2023-01-29 VITALS — BP 143/75 | HR 76

## 2023-01-29 DIAGNOSIS — D494 Neoplasm of unspecified behavior of bladder: Secondary | ICD-10-CM | POA: Diagnosis not present

## 2023-01-29 DIAGNOSIS — C679 Malignant neoplasm of bladder, unspecified: Secondary | ICD-10-CM | POA: Diagnosis not present

## 2023-01-29 LAB — MICROSCOPIC EXAMINATION

## 2023-01-29 LAB — URINALYSIS, COMPLETE
Bilirubin, UA: NEGATIVE
Glucose, UA: NEGATIVE
Ketones, UA: NEGATIVE
Leukocytes,UA: NEGATIVE
Nitrite, UA: NEGATIVE
Protein,UA: NEGATIVE
RBC, UA: NEGATIVE
Specific Gravity, UA: 1.025 (ref 1.005–1.030)
Urobilinogen, Ur: 0.2 mg/dL (ref 0.2–1.0)
pH, UA: 5.5 (ref 5.0–7.5)

## 2023-01-29 MED ORDER — BCG LIVE 50 MG IS SUSR
3.2400 mL | Freq: Once | INTRAVESICAL | Status: AC
Start: 2023-01-29 — End: 2023-01-29
  Administered 2023-01-29: 81 mg via INTRAVESICAL

## 2023-01-29 NOTE — Progress Notes (Signed)
BCG Bladder Instillation  BCG # 6 of 6  Due to Bladder Cancer patient is present today for a BCG treatment. Patient was cleaned and prepped in a sterile fashion with betadine. A 14FR catheter was inserted, urine return was noted 20ml, urine was yellow in color.  50ml of reconstituted BCG was instilled into the bladder. The catheter was then removed. Patient tolerated well, no complications were noted  Performed by: Carman Ching, PA-C and Humberta Magallon-Mariche, CMA  Follow up/ Additional notes: Return in about 6 weeks (around 03/12/2023) for Cysto with Dr. Richardo Hanks.

## 2023-03-03 ENCOUNTER — Other Ambulatory Visit: Payer: Self-pay | Admitting: Internal Medicine

## 2023-03-11 ENCOUNTER — Encounter: Payer: Self-pay | Admitting: Internal Medicine

## 2023-03-11 ENCOUNTER — Ambulatory Visit (INDEPENDENT_AMBULATORY_CARE_PROVIDER_SITE_OTHER): Payer: Medicare Other | Admitting: Internal Medicine

## 2023-03-11 VITALS — BP 132/74 | HR 63 | Temp 97.9°F | Ht 65.0 in | Wt 187.0 lb

## 2023-03-11 DIAGNOSIS — Z23 Encounter for immunization: Secondary | ICD-10-CM | POA: Diagnosis not present

## 2023-03-11 DIAGNOSIS — Z7984 Long term (current) use of oral hypoglycemic drugs: Secondary | ICD-10-CM | POA: Diagnosis not present

## 2023-03-11 DIAGNOSIS — Z1159 Encounter for screening for other viral diseases: Secondary | ICD-10-CM | POA: Diagnosis not present

## 2023-03-11 DIAGNOSIS — C679 Malignant neoplasm of bladder, unspecified: Secondary | ICD-10-CM | POA: Insufficient documentation

## 2023-03-11 DIAGNOSIS — E1159 Type 2 diabetes mellitus with other circulatory complications: Secondary | ICD-10-CM

## 2023-03-11 DIAGNOSIS — I10 Essential (primary) hypertension: Secondary | ICD-10-CM

## 2023-03-11 LAB — POCT GLYCOSYLATED HEMOGLOBIN (HGB A1C): Hemoglobin A1C: 7.6 % — AB (ref 4.0–5.6)

## 2023-03-11 NOTE — Progress Notes (Signed)
Vision Screening   Right eye Left eye Both eyes  Without correction     With correction 20/30 20/30 20/25   Hearing Screening - Comments:: Did not pass whisper test

## 2023-03-11 NOTE — Progress Notes (Signed)
Subjective:    Patient ID: Monica Harrington, female    DOB: 11-Dec-1945, 77 y.o.   MRN: [id number]  HPI Here for follow up of diabetes and other chronic health conditions No time for AMW at this appointment  New diagnosis of bladder cancer Has been getting BCG after TURBT Due to repeat cysto on Friday for reevaluation  Doing well otherwise Sugars 76-120 No low sugar reactions  No chest pain or SOB No dizziness or syncope No edema  Current Outpatient Medications on File Prior to Visit  Medication Sig Dispense Refill   atorvastatin (LIPITOR) 20 MG tablet TAKE 1 TABLET BY MOUTH EVERY DAY 90 tablet 3   glipiZIDE (GLUCOTROL XL) 5 MG 24 hr tablet TAKE 1 TABLET BY MOUTH EVERY DAY WITH BREAKFAST 90 tablet 3   glucose blood (ONETOUCH VERIO) test strip Use to check blood sugar once a day Dx Code E11.9 100 each 3   Lancets (ONETOUCH DELICA PLUS LANCET33G) MISC CHECK BLOOD SUGAR ONCE DAILY. DX E11.9 100 each 7   losartan-hydrochlorothiazide (HYZAAR) 50-12.5 MG tablet TAKE 1 TABLET BY MOUTH EVERY DAY 90 tablet 3   metFORMIN (GLUCOPHAGE) 1000 MG tablet TAKE 1 TABLET (1,000 MG TOTAL) BY MOUTH TWICE A DAY WITH FOOD 180 tablet 3   Multiple Vitamins-Minerals (CENTRUM SILVER ULTRA WOMENS PO) Take 1 tablet by mouth daily.     pantoprazole (PROTONIX) 40 MG tablet TAKE 1 TABLET BY MOUTH EVERY DAY 90 tablet 3   No current facility-administered medications on file prior to visit.    No Known Allergies  Past Medical History:  Diagnosis Date   Bladder tumor 09/2022   Cancer (HCC)    Diabetes mellitus, type 2 (HCC)    GERD (gastroesophageal reflux disease)    Hyperlipidemia    Hypertension    Osteoarthritis of both shoulders     Past Surgical History:  Procedure Laterality Date   BLADDER INSTILLATION N/A 09/28/2022   Procedure: BLADDER INSTILLATION OF GEMCITABINE;  Surgeon: Sondra Come, MD;  Location: [facility];  Service: Urology;  Laterality: N/A;   BREAST BIOPSY Left 05/17/2016   neg    COLONOSCOPY WITH PROPOFOL N/A 04/29/2018   Procedure: COLONOSCOPY WITH PROPOFOL;  Surgeon: Toledo, Boykin Nearing, MD;  Location: [facility];  Service: Gastroenterology;  Laterality: N/A;   FOOT FRACTURE SURGERY Right 11/2006   TRANSURETHRAL RESECTION OF BLADDER TUMOR N/A 09/28/2022   Procedure: TRANSURETHRAL RESECTION OF BLADDER TUMOR (TURBT);  Surgeon: Sondra Come, MD;  Location: [facility];  Service: Urology;  Laterality: N/A;   TRANSURETHRAL RESECTION OF BLADDER TUMOR N/A 11/02/2022   Procedure: TRANSURETHRAL RESECTION OF BLADDER TUMOR (TURBT);  Surgeon: Sondra Come, MD;  Location: [facility];  Service: Urology;  Laterality: N/A;    Family History  Problem Relation Age of Onset   Alcohol abuse Father    Hypertension Sister    Hypertension Sister    Cancer Neg Hx    Diabetes Neg Hx    Heart disease Neg Hx     Social History   Socioeconomic History   Marital status: Widowed    Spouse name: Not on file   Number of children: Not on file   Years of education: Not on file   Highest education level: Not on file  Occupational History   Occupation: Housekeeping    Employer: [employer]    Comment: Retired  Tobacco Use   Smoking status: Former    Current packs/day: 0.00    Types: Cigarettes  Quit date: 01/07/2004    Years since quitting: 19.1    Passive exposure: Never   Smokeless tobacco: Never  Vaping Use   Vaping status: Never Used  Substance and Sexual Activity   Alcohol use: Not Currently    Comment:     Drug use: Never   Sexual activity: Not on file  Other Topics Concern   Not on file  Social History Narrative   Widowed ~2014      No living will   Would want sister to make health care decisions--- Elease Hashimoto   Would accept resuscitation--but no prolonged life support   Not sure about tube feeds--but wouldn't want if cognitively unaware   Social Determinants of Health   Financial Resource Strain: Low Risk  (04/17/2021)   Overall Financial Resource Strain  (CARDIA)    Difficulty of Paying Living Expenses: Not very hard  Food Insecurity: Not on file  Transportation Needs: Not on file  Physical Activity: Not on file  Stress: Not on file  Social Connections: Not on file  Intimate Partner Violence: Not on file   Review of Systems Weight is stable  Sleeping okay    Objective:   Physical Exam Constitutional:      Appearance: Normal appearance.  Cardiovascular:     Rate and Rhythm: Normal rate and regular rhythm.     Pulses: Normal pulses.     Heart sounds: No murmur heard.    No gallop.  Pulmonary:     Effort: Pulmonary effort is normal.     Breath sounds: Normal breath sounds. No wheezing or rales.  Musculoskeletal:     Cervical back: Neck supple.     Right lower leg: No edema.     Left lower leg: No edema.  Lymphadenopathy:     Cervical: No cervical adenopathy.  Skin:    Findings: No rash.     Comments: No foot lesions  Neurological:     Mental Status: She is alert.     Comments: Normal sensation in feet  Psychiatric:        Mood and Affect: Mood normal.        Behavior: Behavior normal.            Assessment & Plan:

## 2023-03-11 NOTE — Addendum Note (Signed)
Addended by: Eual Fines on: 03/11/2023 10:37 AM   Modules accepted: Orders

## 2023-03-11 NOTE — Assessment & Plan Note (Signed)
BP Readings from Last 3 Encounters:  03/11/23 132/74  01/29/23 (!) 143/75  01/15/23 135/74   Controlled on losartan/hydrochlorothiazide 50/12.5

## 2023-03-11 NOTE — Assessment & Plan Note (Signed)
Had resection and BCG Due for follow up cysto

## 2023-03-11 NOTE — Assessment & Plan Note (Signed)
A1c slightly better at 7.6% Will continue the glipizide 5 and metformin 1000 bid

## 2023-03-11 NOTE — Addendum Note (Signed)
Addended by: Tillman Abide I on: 03/11/2023 09:41 AM   Modules accepted: Level of Service

## 2023-03-14 ENCOUNTER — Ambulatory Visit: Payer: Medicare Other | Admitting: Urology

## 2023-03-14 ENCOUNTER — Encounter: Payer: Self-pay | Admitting: Urology

## 2023-03-14 ENCOUNTER — Other Ambulatory Visit: Payer: Self-pay

## 2023-03-14 VITALS — BP 132/80 | HR 79 | Ht 65.0 in

## 2023-03-14 DIAGNOSIS — Z8551 Personal history of malignant neoplasm of bladder: Secondary | ICD-10-CM | POA: Diagnosis not present

## 2023-03-14 DIAGNOSIS — D494 Neoplasm of unspecified behavior of bladder: Secondary | ICD-10-CM | POA: Diagnosis not present

## 2023-03-14 LAB — MICROSCOPIC EXAMINATION: Epithelial Cells (non renal): >10 /[HPF] — AB (ref 0–10)

## 2023-03-14 LAB — URINALYSIS, COMPLETE
Bilirubin, UA: NEGATIVE
Glucose, UA: NEGATIVE
Ketones, UA: NEGATIVE
Leukocytes,UA: NEGATIVE
Nitrite, UA: NEGATIVE
Protein,UA: NEGATIVE
RBC, UA: NEGATIVE
Specific Gravity, UA: 1.02 (ref 1.005–1.030)
Urobilinogen, Ur: 0.2 mg/dL (ref 0.2–1.0)
pH, UA: 5.5 (ref 5.0–7.5)

## 2023-03-14 NOTE — Progress Notes (Signed)
Surgical Physician Order Form [facility] [place]  Dr. Legrand Rams, MD  * Scheduling expectation : Next Available, 12/13 if okay with patient  *Length of Case: 30 minutes  *Clearance needed: no  *Anticoagulation Instructions: Hold all anticoagulants  *Aspirin Instructions: Hold Aspirin  *Post-op visit Date/Instructions:  tbd  *Diagnosis: Bladder Tumor  *Procedure:  Cysto Bladder Biopsy (29528)   Additional orders: Gemcitabine 2000mg  bladder instillation  -Admit type: OUTpatient  -Anesthesia: General  -VTE Prophylaxis Standing Order SCD's       Other:   -Standing Lab Orders Per Anesthesia    Lab other: UA&Urine Culture sent 12/5  -Standing Test orders EKG/Chest x-ray per Anesthesia       Test other:   - Medications:  Ancef 2gm IV  -Other orders:  N/A

## 2023-03-14 NOTE — Progress Notes (Signed)
Bladder cancer surveillance note  INDICATION History of bladder cancer diagnosed June 2024  UROLOGIC HISTORY  [name] is a 77 y.o. female who presented with gross hematuria and was found to have a 3 cm bladder tumor on CT.  Underwent initial TURBT in June 2024, postop gemcitabine given.    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 Cystoscopy was performed with the a flexible cystoscope. The urethra, bladder neck and entire bladder was visualized in a standard fashion.  Small papillary recurrence at the posterior left upper wall measuring approximately 5 mm. The ureteral orifices were visualized in their normal location and orientation.  No abnormalities on retroflexion  -------------------------------------------------------------------  77 year old female diagnosed with HG non-muscle invasive bladder cancer in June 2024, tolerated induction BCG well, found to have small 5 mm papillary recurrence on cystoscopy today  We discussed transurethral resection of bladder tumor (TURBT) and risks and benefits at length. This is typically a 1 to 2-hour procedure done under general anesthesia in the operating room.  A scope is inserted through the urethra and used to resect abnormal tissue within the bladder, which is then sent to the pathologist to determine grade and stage of the tumor.  Risks include bleeding, infection, need for temporary Foley placement, and bladder perforation.  Treatment strategies are based on the type of tumor and depth of invasion.  We briefly reviewed the different treatment pathways for non-muscle invasive and  muscle invasive bladder cancer.  Schedule cystoscopy, bladder biopsy, fulguration, gemcitabine  Legrand Rams, MD 03/14/2023

## 2023-03-14 NOTE — Patient Instructions (Signed)
Transurethral Resection of Bladder Tumor  Transurethral resection of a bladder tumor is the removal (resection) of cancerous tissue (tumor) from the inside wall of the bladder. The bladder is the organ that holds urine. The tumor is removed through the tube that carries urine out of the body (urethra). In a transurethral resection, a thin telescope with a light, a tiny camera, and an electric cutting edge (resectoscope) is passed through the urethra. In men, the opening of the urethra is at the end of the penis. In women, it is just above the opening of the vagina. Tell a health care provider about: Any allergies you have. All medicines you are taking, including vitamins, herbs, eye drops, creams, and over-the-counter medicines. Any problems you or family members have had with anesthetic medicines. Any bleeding problems you have. Any surgeries you have had. Any medical conditions you have, including recent urinary tract infections. Whether you are pregnant or may be pregnant. What are the risks? Generally, this is a safe procedure. However, problems may occur, including: Infection. Bleeding. Allergic reactions to medicines. Damage to nearby structures or organs. Difficulty urinating from blockage of the urethra or not being able to urinate (urinary retention). Deep vein thrombosis. This is a blood clot that can develop in your leg. Recurring cancer. What happens before the procedure? When to stop eating and drinking Follow instructions from your health care provider about what you may eat and drink before your procedure. These may include: 8 hours before your procedure Stop eating most foods. Do not eat meat, fried foods, or fatty foods. Eat only light foods, such as toast or crackers. All liquids are okay except energy drinks and alcohol. 6 hours before your procedure Stop eating. Drink only clear liquids, such as water, clear fruit juice, black coffee, plain tea, and sports  drinks. Do not drink energy drinks or alcohol. 2 hours before your procedure Stop drinking all liquids. You may be allowed to take medicines with small sips of water. Medicines Ask your health care provider about: Changing or stopping your regular medicines. This is especially important if you are taking diabetes medicines or blood thinners. Taking medicines such as aspirin and ibuprofen. These medicines can thin your blood. Do not take these medicines unless your health care provider tells you to take them. Taking over-the-counter medicines, vitamins, herbs, and supplements. General instructions If you will be going home right after the procedure, plan to have a responsible adult: Take you home from the hospital or clinic. You will not be allowed to drive. Care for you for the time you are told. Ask your health care provider what steps will be taken to help prevent infection. These steps may include: Washing skin with a germ-killing soap. Taking antibiotic medicine. Do not use any products that contain nicotine or tobacco for at least 4 weeks before the procedure. These products include cigarettes, chewing tobacco, and vaping devices, such as e-cigarettes. If you need help quitting, ask your health care provider. What happens during the procedure? An IV will be inserted into one of your veins. You will be given one or more of the following: A medicine to help you relax (sedative). A medicine that is injected into your spine to numb the area below and slightly above the injection site (spinal anesthetic). A medicine that is injected into an area of your body to numb everything below the injection site (regional anesthetic). A medicine to make you fall asleep (general anesthetic). Your legs will be placed  in foot rests (stirrups) to open your legs and bend your knees. The resectoscope will be passed through your urethra and into your bladder. The part of your bladder with the tumor will be  resected by the cutting edge of the resectoscope. Fluid will be passed to rinse out the cut tissues (irrigation). The resectoscope will then be taken out. A small, thin tube (catheter) will be passed through your urethra and into your bladder. The catheter will drain urine into a bag outside of your body. The procedure may vary among health care providers and hospitals. What happens after the procedure? Your blood pressure, heart rate, breathing rate, and blood oxygen level will be monitored until you leave the hospital or clinic. You may continue to receive fluids and medicines through an IV. You will be given pain medicine to relieve pain. You will have a catheter to drain your urine. The amount of urine will be measured. If you have blood in your urine, your bladder may be rinsed out by passing fluid through your catheter. You will be encouraged to walk as soon as you can. You may have to wear compression stockings. These stockings help to prevent blood clots and reduce swelling in your legs. If you were given a sedative during the procedure, it can affect you for several hours. Do not drive or operate machinery until your health care provider says that it is safe. Summary Transurethral resection of a bladder tumor is the removal (resection) of a cancerous growth (tumor) on the inside wall of the bladder. To do this procedure, your health care provider uses a thin telescope with a light, a tiny camera, and an electric cutting edge (resectoscope) that is guided to your bladder through your urethra. The part of your bladder that is affected by the tumor will be resected by the cutting edge of the resectoscope. A catheter will be passed through your urethra and into your bladder. The catheter will drain urine into a bag outside of your body. If you will be going home right after the procedure, plan to have a responsible adult take you home from the hospital or clinic. You will not be allowed to  drive. This information is not intended to replace advice given to you by your health care provider. Make sure you discuss any questions you have with your health care provider. Document Revised: 03/31/2021 Document Reviewed: 03/31/2021 Elsevier Patient Education  2024 [employer].

## 2023-03-15 NOTE — Progress Notes (Signed)
    [facility] Posting Form  Surgery Date: Date: 03/22/2023  Surgeon: Dr. Legrand Rams, MD  Inpt ( No  )   Outpt (Yes)   Obs ( No  )   Diagnosis: D49.4 Bladder Tumor  -CPT: 16109, [phone]  Surgery: Cystoscopy with Bladder Biopsy with Intravesical Instillation of Gemcitabine  Stop Anticoagulations: Yes and also hold ASA  Cardiac/Medical/Pulmonary Clearance needed:  No  *Orders entered into EPIC  Date: 03/15/23   *Case booked in EPIC  Date: 03/15/23  *Notified pt of Surgery: Date: 03/15/23  PRE-OP UA & CX: yes, obtained in clinic on 03/14/2023  *Placed into Prior Authorization Work Angela Nevin Date: 03/15/23  Assistant/laser/rep:No

## 2023-03-18 LAB — CULTURE, URINE COMPREHENSIVE

## 2023-03-19 ENCOUNTER — Encounter
Admission: RE | Admit: 2023-03-19 | Discharge: 2023-03-19 | Disposition: A | Discharge status: Home / Self Care | Payer: Medicare Other | Source: Ambulatory Visit | Attending: Urology | Admitting: Urology

## 2023-03-19 ENCOUNTER — Other Ambulatory Visit: Payer: Self-pay

## 2023-03-19 ENCOUNTER — Other Ambulatory Visit: Payer: Self-pay | Admitting: Urology

## 2023-03-19 DIAGNOSIS — E1159 Type 2 diabetes mellitus with other circulatory complications: Secondary | ICD-10-CM | POA: Diagnosis not present

## 2023-03-19 DIAGNOSIS — I10 Essential (primary) hypertension: Secondary | ICD-10-CM

## 2023-03-19 DIAGNOSIS — Z01812 Encounter for preprocedural laboratory examination: Secondary | ICD-10-CM | POA: Insufficient documentation

## 2023-03-19 LAB — BASIC METABOLIC PANEL
Anion gap: 9 (ref 5–15)
BUN: 11 mg/dL (ref 8–23)
CO2: 23 mmol/L (ref 22–32)
Calcium: 8.9 mg/dL (ref 8.9–10.3)
Chloride: 106 mmol/L (ref 98–111)
Creatinine, Ser: 0.76 mg/dL (ref 0.44–1.00)
GFR, Estimated: >60 mL/min (ref >60)
Glucose, Bld: 72 mg/dL (ref 70–99)
Potassium: 4 mmol/L (ref 3.5–5.1)
Sodium: 138 mmol/L (ref 135–145)

## 2023-03-19 LAB — CBC
HCT: 36.1 % (ref 36.0–46.0)
Hemoglobin: 11.7 g/dL — ABNORMAL LOW (ref 12.0–15.0)
MCH: 28.8 pg (ref 26.0–34.0)
MCHC: 32.4 g/dL (ref 30.0–36.0)
MCV: 88.9 fL (ref 80.0–100.0)
Platelets: 367 10*3/uL (ref 150–400)
RBC: 4.06 MIL/uL (ref 3.87–5.11)
RDW: 14.5 % (ref 11.5–15.5)
WBC: 9.4 10*3/uL (ref 4.0–10.5)
nRBC: 0 % (ref 0.0–0.2)

## 2023-03-19 NOTE — Patient Instructions (Addendum)
Your procedure is scheduled on: Friday,  03/22/2023  Report to the Registration Desk on the 1st floor of the [facility]. To find out your arrival time, please call [phone] between 1PM - 3PM on: Thursday, 03/21/2023 If your arrival time is 6:00 am, do not arrive before that time as the [facility] entrance doors do not open until 6:00 am.  REMEMBER: Instructions that are not followed completely may result in serious medical risk, up to and including death; or upon the discretion of your surgeon and anesthesiologist your surgery may need to be rescheduled.  Do not eat food after midnight the night before surgery.  No gum chewing or hard candies.  You may however, drink CLEAR liquids up to 2 hours before you are scheduled to arrive for your surgery. Do not drink anything within 2 hours of your scheduled arrival time.  Clear liquids include: - water     One week prior to surgery: Stop Anti-inflammatories (NSAIDS) such as Advil, Aleve, Ibuprofen, Motrin, Naproxen, Naprosyn and Aspirin based products such as Excedrin, Goody's Powder, BC Powder. Stop ANY OVER THE COUNTER supplements until after surgery.  You may however, continue to take Tylenol if needed for pain up until the day of surgery.  Stop metformin two days prior to surgery.  Last dose is Dec 10    Continue taking all of your other prescription medications up until the day of surgery.  ON THE DAY OF SURGERY ONLY TAKE THESE MEDICATIONS WITH SIPS OF WATER:  atorvastatin (LIPITOR) pantoprazole (PROTONIX)  No Alcohol for 24 hours before or after surgery.  No Smoking including e-cigarettes for 24 hours before surgery.  No chewable tobacco products for at least 6 hours before surgery.  No nicotine patches on the day of surgery.  Do not use any "recreational" drugs for at least a week (preferably 2 weeks) before your surgery.  Please be advised that the combination of cocaine and anesthesia may have negative outcomes,  up to and including death. If you test positive for cocaine, your surgery will be cancelled.  On the morning of surgery brush your teeth with toothpaste and water, you may rinse your mouth with mouthwash if you wish. Do not swallow any toothpaste or mouthwash.  Please shower on day of surgery.  Do not wear jewelry, make-up, hairpins, clips or nail polish.  For welded (permanent) jewelry: bracelets, anklets, waist bands, etc.  Please have this removed prior to surgery.  If it is not removed, there is a chance that hospital personnel will need to cut it off on the day of surgery.  Do not wear lotions, powders, or perfumes.   Do not shave body hair from the neck down 48 hours before surgery.  Contact lenses, hearing aids and dentures may not be worn into surgery.  Do not bring valuables to the hospital. [facility] is not responsible for any missing/lost belongings or valuables.      Notify your doctor if there is any change in your medical condition (cold, fever, infection).  Wear comfortable clothing (specific to your surgery type) to the hospital.  After surgery, you can help prevent lung complications by doing breathing exercises.  Take deep breaths and cough every 1-2 hours. Your doctor may order a device called an Incentive Spirometer to help you take deep breaths.  If you are being admitted to the hospital overnight, leave your suitcase in the car. After surgery it may be brought to your room.  In case of increased patient  census, it may be necessary for you, the patient, to continue your postoperative care in the Same Day Surgery department.  If you are being discharged the day of surgery, you will not be allowed to drive home. You will need a responsible individual to drive you home and stay with you for 24 hours after surgery.    Please call the Pre-admissions Testing Dept. at [phone] if you have any questions about these instructions.  Surgery Visitation  Policy:  Patients having surgery or a procedure may have two visitors.  Children under the age of 38 must have an adult with them who is not the patient.

## 2023-03-22 ENCOUNTER — Other Ambulatory Visit: Payer: Self-pay

## 2023-03-22 ENCOUNTER — Ambulatory Visit
Admission: RE | Admit: 2023-03-22 | Discharge: 2023-03-22 | Disposition: A | Discharge status: Home / Self Care | Payer: Medicare Other | Attending: Urology | Admitting: Urology

## 2023-03-22 ENCOUNTER — Encounter: Payer: Self-pay | Admitting: Urology

## 2023-03-22 ENCOUNTER — Ambulatory Visit: Payer: Medicare Other | Admitting: General Practice

## 2023-03-22 ENCOUNTER — Encounter
Admission: RE | Disposition: A | Discharge status: Home / Self Care | Payer: Self-pay | Source: Home / Self Care | Attending: Urology

## 2023-03-22 ENCOUNTER — Ambulatory Visit: Payer: Medicare Other | Admitting: Urgent Care

## 2023-03-22 DIAGNOSIS — K219 Gastro-esophageal reflux disease without esophagitis: Secondary | ICD-10-CM | POA: Diagnosis not present

## 2023-03-22 DIAGNOSIS — I10 Essential (primary) hypertension: Secondary | ICD-10-CM | POA: Diagnosis not present

## 2023-03-22 DIAGNOSIS — Z7984 Long term (current) use of oral hypoglycemic drugs: Secondary | ICD-10-CM | POA: Diagnosis not present

## 2023-03-22 DIAGNOSIS — C674 Malignant neoplasm of posterior wall of bladder: Secondary | ICD-10-CM

## 2023-03-22 DIAGNOSIS — C671 Malignant neoplasm of dome of bladder: Secondary | ICD-10-CM | POA: Diagnosis not present

## 2023-03-22 DIAGNOSIS — D090 Carcinoma in situ of bladder: Secondary | ICD-10-CM | POA: Diagnosis not present

## 2023-03-22 DIAGNOSIS — J449 Chronic obstructive pulmonary disease, unspecified: Secondary | ICD-10-CM | POA: Diagnosis not present

## 2023-03-22 DIAGNOSIS — E119 Type 2 diabetes mellitus without complications: Secondary | ICD-10-CM | POA: Diagnosis not present

## 2023-03-22 DIAGNOSIS — E1159 Type 2 diabetes mellitus with other circulatory complications: Secondary | ICD-10-CM

## 2023-03-22 DIAGNOSIS — D494 Neoplasm of unspecified behavior of bladder: Secondary | ICD-10-CM

## 2023-03-22 DIAGNOSIS — Z87891 Personal history of nicotine dependence: Secondary | ICD-10-CM | POA: Insufficient documentation

## 2023-03-22 DIAGNOSIS — C679 Malignant neoplasm of bladder, unspecified: Secondary | ICD-10-CM | POA: Diagnosis not present

## 2023-03-22 HISTORY — PX: BLADDER INSTILLATION: SHX6893

## 2023-03-22 HISTORY — PX: CYSTOSCOPY WITH BIOPSY: SHX5122

## 2023-03-22 LAB — GLUCOSE, CAPILLARY
Glucose-Capillary: 140 mg/dL — ABNORMAL HIGH (ref 70–99)
Glucose-Capillary: 132 mg/dL — ABNORMAL HIGH (ref 70–99)

## 2023-03-22 SURGERY — CYSTOSCOPY, WITH BIOPSY
Anesthesia: General

## 2023-03-22 MED ORDER — FENTANYL CITRATE (PF) 100 MCG/2ML IJ SOLN
INTRAMUSCULAR | Status: DC | PRN
  Administered 2023-03-22: 25 ug via INTRAVENOUS

## 2023-03-22 MED ORDER — SODIUM CHLORIDE 0.9% FLUSH: 10.0000 mL | Freq: Two times a day (BID) | INTRAVENOUS | Status: DC

## 2023-03-22 MED ORDER — CHLORHEXIDINE GLUCONATE 0.12 % MT SOLN
OROMUCOSAL | Status: AC
  Filled 2023-03-22: qty 15

## 2023-03-22 MED ORDER — ORAL CARE MOUTH RINSE: 15.0000 mL | Freq: Once | OROMUCOSAL | Status: AC

## 2023-03-22 MED ORDER — DEXAMETHASONE SODIUM PHOSPHATE 10 MG/ML IJ SOLN
INTRAMUSCULAR | Status: DC | PRN
  Administered 2023-03-22: 10 mg via INTRAVENOUS

## 2023-03-22 MED ORDER — CHLORHEXIDINE GLUCONATE 0.12 % MT SOLN
15.0000 mL | Freq: Once | OROMUCOSAL | Status: AC
  Administered 2023-03-22: 15 mL via OROMUCOSAL

## 2023-03-22 MED ORDER — FENTANYL CITRATE (PF) 100 MCG/2ML IJ SOLN
INTRAMUSCULAR | Status: AC
  Filled 2023-03-22: qty 2

## 2023-03-22 MED ORDER — CEFAZOLIN SODIUM-DEXTROSE 2-4 GM/100ML-% IV SOLN
INTRAVENOUS | Status: AC
  Filled 2023-03-22: qty 100

## 2023-03-22 MED ORDER — LIDOCAINE HCL (CARDIAC) PF 100 MG/5ML IV SOSY
PREFILLED_SYRINGE | INTRAVENOUS | Status: DC | PRN
  Administered 2023-03-22: 80 mg via INTRAVENOUS

## 2023-03-22 MED ORDER — STERILE WATER FOR IRRIGATION IR SOLN
Status: DC | PRN
  Administered 2023-03-22: 1

## 2023-03-22 MED ORDER — PROPOFOL 10 MG/ML IV BOLUS
INTRAVENOUS | Status: DC | PRN
  Administered 2023-03-22: 130 mg via INTRAVENOUS

## 2023-03-22 MED ORDER — SODIUM CHLORIDE 0.9 % IV SOLN: INTRAVENOUS | Status: DC | PRN

## 2023-03-22 MED ORDER — CEFAZOLIN SODIUM-DEXTROSE 2-4 GM/100ML-% IV SOLN
2.0000 g | INTRAVENOUS | Status: AC
  Administered 2023-03-22: 2 g via INTRAVENOUS

## 2023-03-22 MED ORDER — GEMCITABINE CHEMO FOR BLADDER INSTILLATION 2000 MG
2000.0000 mg | Freq: Once | INTRAVENOUS | Status: DC
  Filled 2023-03-22: qty 52.6

## 2023-03-22 MED ORDER — ONDANSETRON HCL 4 MG/2ML IJ SOLN
INTRAMUSCULAR | Status: DC | PRN
  Administered 2023-03-22: 4 mg via INTRAVENOUS

## 2023-03-22 SURGICAL SUPPLY — 14 items
BAG DRAIN SIEMENS DORNER NS (MISCELLANEOUS) ×1 IMPLANT
BRUSH SCRUB EZ 4% CHG (MISCELLANEOUS) ×1 IMPLANT
DRSG TELFA 3X4 N-ADH STERILE (GAUZE/BANDAGES/DRESSINGS) ×1 IMPLANT
ELECT REM PT RETURN 9FT ADLT (ELECTROSURGICAL) ×1
ELECTRODE REM PT RTRN 9FT ADLT (ELECTROSURGICAL) ×1 IMPLANT
GLOVE BIOGEL PI IND STRL 7.5 (GLOVE) ×1 IMPLANT
GOWN STRL REUS W/ TWL LRG LVL3 (GOWN DISPOSABLE) ×1 IMPLANT
GOWN STRL REUS W/ TWL XL LVL3 (GOWN DISPOSABLE) ×1 IMPLANT
KIT TURNOVER CYSTO (KITS) ×1 IMPLANT
PACK CYSTO AR (MISCELLANEOUS) ×1 IMPLANT
SET CYSTO W/LG BORE CLAMP LF (SET/KITS/TRAYS/PACK) ×1 IMPLANT
SURGILUBE 2OZ TUBE FLIPTOP (MISCELLANEOUS) ×1 IMPLANT
WATER STERILE IRR 1000ML POUR (IV SOLUTION) ×1 IMPLANT
WATER STERILE IRR 3000ML UROMA (IV SOLUTION) ×1 IMPLANT

## 2023-03-22 NOTE — Anesthesia Preprocedure Evaluation (Signed)
Anesthesia Evaluation  Patient identified by MRN, date of birth, ID band Patient awake    Reviewed: Allergy & Precautions, H&P , NPO status , Patient's Chart, lab work & pertinent test results  History of Anesthesia Complications Negative for: history of anesthetic complications  Airway Mallampati: III  TM Distance: >3 FB Neck ROM: limited    Dental  (+) Chipped, Poor Dentition, Missing, Dental Advidsory Given   Pulmonary neg shortness of breath, COPD, Patient abstained from smoking., former smoker          Cardiovascular Exercise Tolerance: Good hypertension, (-) angina (-) Past MI and (-) DOE      Neuro/Psych negative neurological ROS  negative psych ROS   GI/Hepatic Neg liver ROS,GERD  Medicated and Controlled,,  Endo/Other  diabetes, Type 2, Oral Hypoglycemic Agents    Renal/GU      Musculoskeletal   Abdominal   Peds  Hematology negative hematology ROS (+)   Anesthesia Other Findings Past Medical History: No date: Diabetes mellitus, type 2 (HCC) No date: Hyperlipidemia No date: Hypertension No date: Osteoarthritis of both shoulders  Past Surgical History: 05/17/2016: BREAST BIOPSY; Left     Comment:  neg 8/08: FOOT FRACTURE SURGERY; Right No date: FRACTURE SURGERY No date: NO PAST SURGERIES  BMI    Body Mass Index:  25.50 kg/m      Reproductive/Obstetrics negative OB ROS                             Anesthesia Physical Anesthesia Plan  ASA: 3  Anesthesia Plan: General ETT and General   Post-op Pain Management:    Induction: Intravenous  PONV Risk Score and Plan: 4 or greater and Midazolam, Ondansetron and Dexamethasone  Airway Management Planned: LMA  Additional Equipment:   Intra-op Plan:   Post-operative Plan: Extubation in OR  Informed Consent: I have reviewed the patients History and Physical, chart, labs and discussed the procedure including the risks,  benefits and alternatives for the proposed anesthesia with the patient or authorized representative who has indicated his/her understanding and acceptance.     Dental Advisory Given  Plan Discussed with: Anesthesiologist, CRNA and Surgeon  Anesthesia Plan Comments: (Patient consented for risks of anesthesia including but not limited to:  - adverse reactions to medications - damage to eyes, teeth, lips or other oral mucosa - nerve damage due to positioning  - sore throat or hoarseness - Damage to heart, brain, nerves, lungs, other parts of body or loss of life  Patient voiced understanding.)       Anesthesia Quick Evaluation

## 2023-03-22 NOTE — Transfer of Care (Signed)
Immediate Anesthesia Transfer of Care Note  Patient: Monica Harrington  Procedure(s) Performed: CYSTOSCOPY WITH BLADDER BIOPSY BLADDER INSTILLATION OF GEMCITABINE  Patient Location: PACU  Anesthesia Type:General  Level of Consciousness: awake, alert , and oriented  Airway & Oxygen Therapy: Patient Spontanous Breathing  Post-op Assessment: Report given to RN and Post -op Vital signs reviewed and stable  Post vital signs: Reviewed and stable  Last Vitals:  Vitals Value Taken Time  BP 129/70 03/22/23 0917  Temp    Pulse 74 03/22/23 0917  Resp 11 03/22/23 0917  SpO2 93 % 03/22/23 0917  Vitals shown include unfiled device data.  Last Pain:  Vitals:   03/22/23 0739  TempSrc: Oral  PainSc: 0-No pain         Complications: No notable events documented.

## 2023-03-22 NOTE — Anesthesia Procedure Notes (Signed)
Procedure Name: LMA Insertion Date/Time: 03/22/2023 8:48 AM  Performed by: Ginger Carne, CRNAPre-anesthesia Checklist: Patient identified, Emergency Drugs available, Suction available, Patient being monitored and Timeout performed Patient Re-evaluated:Patient Re-evaluated prior to induction Oxygen Delivery Method: Circle system utilized Preoxygenation: Pre-oxygenation with 100% oxygen Induction Type: IV induction Ventilation: Mask ventilation without difficulty Laryngoscope Size: McGrath and 4 Tube type: Oral Number of attempts: 1 Placement Confirmation: positive ETCO2 and breath sounds checked- equal and bilateral Tube secured with: Tape Dental Injury: Teeth and Oropharynx as per pre-operative assessment

## 2023-03-22 NOTE — Op Note (Signed)
Date of procedure: 03/22/23  Preoperative diagnosis:  Bladder tumor (~1cm)  Postoperative diagnosis:  Same  Procedure: Bladder biopsy and fulguration (1cm) Intravesical instillation of gemcitabine  Surgeon: Legrand Rams, MD  Anesthesia: General  Complications: None  Intraoperative findings:  Large cystocele Small papillary recurrence at the left dome of the bladder measuring ~31mm with smaller papillary lesion just adjacent.  Tumor removed entirely and base fulgurated, approximately 1 cm total Excellent hemostasis, postop gemcitabine instilled  EBL: Minimal  Specimens: Bladder biopsy  Drains: 18 French Foley  Indication: Monica Harrington is a 77 y.o. patient with history of high-grade nonmuscle invasive bladder cancer who was found to have a small papillary recurrence on clinic cystoscopy.  After reviewing the management options for treatment, they elected to proceed with the above surgical procedure(s). We have discussed the potential benefits and risks of the procedure, side effects of the proposed treatment, the likelihood of the patient achieving the goals of the procedure, and any potential problems that might occur during the procedure or recuperation. Informed consent has been obtained.  Description of procedure:  The patient was taken to the operating room and general anesthesia was induced. SCDs were placed for DVT prophylaxis. The patient was placed in the dorsal lithotomy position, prepped and draped in the usual sterile fashion, and preoperative antibiotics(Ancef) were administered. A preoperative time-out was performed.   On exam there was a large cystocele.  A 21 French rigid cystoscope was inserted into the bladder and thorough cystoscopy showed a 5 mm papillary lesion at the left dome of the bladder with a small adjacent papillary lesion adjacent.  No other tumors were seen, the bladder was also inspected with a 70 degree lens.  A cold cup biopsy forcep was used to  remove both lesions in their entirety.  The base of the lesions was fulgurated with excellent hemostasis, total size 1 cm.  With the bladder decompressed no bleeding was noted.  A 18 French Foley passed easily to the bladder with return of clear fluid, 10 mL were placed in the balloon.  Bladder was irrigated with 500 mL of sterile water.  The bladder was then drained.  2g/13ml gemcitabine were instilled into the bladder and clamped.  Disposition: Stable to PACU  Plan: Unclamp Foley at 10:05 AM and allow gemcitabine to drain, Foley can be removed prior to discharge Will contact with pathology results, anticipate repeat induction BCG followed by surveillance cystoscopy  Legrand Rams, MD

## 2023-03-22 NOTE — Anesthesia Postprocedure Evaluation (Signed)
Anesthesia Post Note  Patient: Monica Harrington  Procedure(s) Performed: CYSTOSCOPY WITH BLADDER BIOPSY BLADDER INSTILLATION OF GEMCITABINE  Patient location during evaluation: PACU Anesthesia Type: General Level of consciousness: awake and alert Pain management: pain level controlled Vital Signs Assessment: post-procedure vital signs reviewed and stable Respiratory status: spontaneous breathing, nonlabored ventilation, respiratory function stable and patient connected to nasal cannula oxygen Cardiovascular status: blood pressure returned to baseline and stable Postop Assessment: no apparent nausea or vomiting Anesthetic complications: no  No notable events documented.   Last Vitals:  Vitals:   03/22/23 1000 03/22/23 1028  BP: (!) 153/70 (!) 158/75  Pulse: 61 66  Resp: 12 14  Temp:  36.5 C  SpO2: 95% 97%    Last Pain:  Vitals:   03/22/23 1028  TempSrc: Axillary  PainSc: 0-No pain                 Stephanie Coup

## 2023-03-22 NOTE — H&P (Signed)
   03/22/23 8:26 AM   Monica Harrington November 29, 1945 [id number]  CC: Bladder tumor  HPI: 77 year old female with high-grade nonmuscle invasive bladder cancer originally diagnosed in June 2024, recently found to have very small papillary recurrence on routine surveillance cystoscopy.   PMH: Past Medical History:  Diagnosis Date   Bladder tumor 09/2022   Cancer [facility])    Diabetes mellitus, type 2 (HCC)    GERD (gastroesophageal reflux disease)    Hyperlipidemia    Hypertension    Osteoarthritis of both shoulders     Surgical History: Past Surgical History:  Procedure Laterality Date   BLADDER INSTILLATION N/A 09/28/2022   Procedure: BLADDER INSTILLATION OF GEMCITABINE;  Surgeon: Sondra Come, MD;  Location: [facility] ORS;  Service: Urology;  Laterality: N/A;   BREAST BIOPSY Left 05/17/2016   neg   COLONOSCOPY WITH PROPOFOL N/A 04/29/2018   Procedure: COLONOSCOPY WITH PROPOFOL;  Surgeon: Toledo, Boykin Nearing, MD;  Location: [facility] ENDOSCOPY;  Service: Gastroenterology;  Laterality: N/A;   FOOT FRACTURE SURGERY Right 11/2006   TRANSURETHRAL RESECTION OF BLADDER TUMOR N/A 09/28/2022   Procedure: TRANSURETHRAL RESECTION OF BLADDER TUMOR (TURBT);  Surgeon: Sondra Come, MD;  Location: [facility] ORS;  Service: Urology;  Laterality: N/A;   TRANSURETHRAL RESECTION OF BLADDER TUMOR N/A 11/02/2022   Procedure: TRANSURETHRAL RESECTION OF BLADDER TUMOR (TURBT);  Surgeon: Sondra Come, MD;  Location: [facility] ORS;  Service: Urology;  Laterality: N/A;    Family History: Family History  Problem Relation Age of Onset   Alcohol abuse Father    Hypertension Sister    Hypertension Sister    Cancer Neg Hx    Diabetes Neg Hx    Heart disease Neg Hx     Social History:  reports that she quit smoking about 19 years ago. Her smoking use included cigarettes. She has never been exposed to tobacco smoke. She has never used smokeless tobacco. She reports that she does not currently use alcohol. She reports  that she does not use drugs.  Physical Exam: BP 132/76   Pulse 75   Temp 97.8 F (36.6 C) (Oral)   Resp 16   Ht 5\' 5"  (1.651 m)   Wt 84.8 kg   SpO2 95%   BMI 31.11 kg/m    Constitutional:  Alert and oriented, No acute distress. Cardiovascular: Regular rate and rhythm Respiratory: Clear to auscultation bilaterally GI: Abdomen is soft, nontender, nondistended, no abdominal masses   Laboratory Data: Urine culture 12/5 no significant growth  Assessment & Plan:   77 year old female with history of high-grade nonmuscle invasive bladder cancer, originally resected June 2024, no residual disease on second look TURBT.  Now with small papillary recurrence on routine surveillance cystoscopy.  We discussed transurethral resection of bladder tumor (TURBT) and risks and benefits at length. This is typically a 1 to 2-hour procedure done under general anesthesia in the operating room.  A scope is inserted through the urethra and used to resect abnormal tissue within the bladder, which is then sent to the pathologist to determine grade and stage of the tumor.  Risks include bleeding, infection, need for temporary Foley placement, and bladder perforation.  Treatment strategies are based on the type of tumor and depth of invasion.  We briefly reviewed the different treatment pathways for non-muscle invasive and muscle invasive bladder cancer.  Cystoscopy/bladder biopsy/fulguration/gemcitabine today   Legrand Rams, MD 03/22/2023  [facility] [address] [place], [place] [address] [phone]

## 2023-03-23 ENCOUNTER — Encounter: Payer: Self-pay | Admitting: Urology

## 2023-03-25 LAB — SURGICAL PATHOLOGY

## 2023-03-26 ENCOUNTER — Telehealth: Payer: Self-pay

## 2023-03-26 NOTE — Telephone Encounter (Signed)
Called patient no answer. Unable to leave message due to no answering machine. 1st attempt.

## 2023-03-26 NOTE — Telephone Encounter (Signed)
-----   Message from Sondra Come sent at 03/26/2023  9:48 AM EST ----- Bladder biopsy showed early noninvasive bladder cancer similar to prior biopsy as we expected.  Like we previously discussed, would recommend repeat induction BCG, followed by surveillance cystoscopy.  Please schedule induction BCG to start in about 4 to 5 weeks, with cystoscopy with me in 3 months  Thanks Legrand Rams, MD 03/26/2023

## 2023-03-27 NOTE — Telephone Encounter (Signed)
Called pt informed her of the information below pt voiced understanding. Cysto scheduled, messaged Jessica to schedule BCG.

## 2023-04-04 ENCOUNTER — Telehealth: Payer: Self-pay | Admitting: *Deleted

## 2023-04-04 NOTE — Telephone Encounter (Addendum)
Monica Harrington [id number] Oct 01, 1945   Provider- Richardo Hanks    Procedure SWNI:[id number] no auth needed.  Drug Code:J9030    Urothelial Carcinoma of Bladder -  C67.9  Expected date of instillation _______as scheduled _________    Below to be completed by staff member contacting insurance.   BCG verification completed- Yes/No  Pa needed- Yes/No  Insurance contacted - Pa initiated/ complete         Auth number:________[id number]_____________  Approval dates : _____2025_____________      BCG scheduled:______________________yes _______________   Pt aware and instructions given.   JQ aware to order Bcg.  Date:

## 2023-04-21 ENCOUNTER — Other Ambulatory Visit: Payer: Self-pay | Admitting: Internal Medicine

## 2023-04-30 ENCOUNTER — Ambulatory Visit: Payer: Medicare Other | Admitting: Physician Assistant

## 2023-04-30 DIAGNOSIS — D494 Neoplasm of unspecified behavior of bladder: Secondary | ICD-10-CM | POA: Diagnosis not present

## 2023-04-30 DIAGNOSIS — C679 Malignant neoplasm of bladder, unspecified: Secondary | ICD-10-CM | POA: Diagnosis not present

## 2023-04-30 LAB — URINALYSIS, COMPLETE
Bilirubin, UA: NEGATIVE
Glucose, UA: NEGATIVE
Ketones, UA: NEGATIVE
Leukocytes,UA: NEGATIVE
Nitrite, UA: NEGATIVE
Protein,UA: NEGATIVE
RBC, UA: NEGATIVE
Specific Gravity, UA: 1.025 (ref 1.005–1.030)
Urobilinogen, Ur: 0.2 mg/dL (ref 0.2–1.0)
pH, UA: 5.5 (ref 5.0–7.5)

## 2023-04-30 LAB — MICROSCOPIC EXAMINATION: Bacteria, UA: NONE SEEN

## 2023-04-30 MED ORDER — BCG LIVE 50 MG IS SUSR
3.2400 mL | Freq: Once | INTRAVESICAL | Status: AC
  Administered 2023-04-30: 81 mg via INTRAVESICAL

## 2023-04-30 NOTE — Progress Notes (Signed)
BCG Bladder Instillation  BCG # 1 of 6  Due to Bladder Cancer patient is present today for a BCG treatment. Patient was cleaned and prepped in a sterile fashion with betadine. A 14FR catheter was inserted, urine return was noted 5ml, urine was yellow in color.  50ml of reconstituted BCG was instilled into the bladder. The catheter was then removed. Patient tolerated well, no complications were noted  Performed by: Carman Ching, PA-C and Ples Specter, CMA  Additional notes: Reinduction course. Reviewed post-treatment instructions and provided timeline for today. She expressed understanding.  Follow up: 1 week for BCG #2

## 2023-04-30 NOTE — Patient Instructions (Signed)
Your Timeline for Today:  Right now through 12:30pm: Hold your urine and do your quarter turns every 15 minutes. 12:30pm-6:30pm today: Every time you urinate, pour 1/2 cup of bleach into the toilet and let it sit for 15 minutes prior to flushing. 6:30pm onward: Resume your normal routine.   Patient Education: (BCG) Into the Bladder (Intravesical Chemotherapy)  BCG is a vaccine which is used to prevent tuberculosis (TB).  But it's also a helpful treatment for some early bladder cancers.  When BCG goes directly into the bladder the treatment is described as intravesical.  BCG is a type of immunotherapy.  Immunotherapy stimulates the body's immune system to destroy cancer cells.  How it's given BCG treatment is given to you in an outpatient setting.  It takes a few minutes to administer and you can go home as soon as it's finished.  It might be a good idea to ask someone to bring you, particularly the fist time.  Unlike chemotherapy into the bladder, BCG treatment is never given immediately after surgery to remove bladder tumors.  There needs to be a delay usually of at least two weeks after surgery, before you can have it.  You won't be given treatment with BCG if you are unwell or have an infection in your urine.  You're usually asked to limit the amount you drink before your treatment.  This will help to increase the concentration of BCG in your bladder.  Drinking too much before your treatment may make your bladder feel uncomfortably full.  If you normally take water tablets (diuretics) take them later in the day after your treatment.  Your nurse or doctor will give you more advise about preparing for your treatment.  You will have a small tube (catheter) placed into your bladder.  Your doctor will then put the liquid vaccine directly into your bladder through the catheter and remove the catheter.  You will need to hold your urine for two hours afterwards.  Rotating every 15 minutes from side to  side. This can be difficult but it's to give the treatment time to work.  When the treatment is over you can go to the toilet.  After your treatment there are some precautions you'll need to take.  This is because BCG is a live vaccine and other people shouldn't be exposed to it.  For the next six hours, you'll need to avoid your urine splashing on the toilet seat and getting any urine on your hands.  It might be easer for men to sit down when they're using an ordinary toilet although using a stand up urinal should be alright.  The main this is to avoid splashing urine and spreading the vaccine.  You will also be asked to put 1/2 cup undiluted bleach into the toilet to destroy any live vaccine and leave it for 15 minutes until you flush for the next 6 voids.  Side Effects Because BCG goes directly into the bladder most of the side effects are linked with the bladder.  They usually go away within one to two days after your treatment.  The most common ones are: -needing to pass urine often -pain when you pass urine -blood in urine -flu-like symptoms (tiredness, general aching and raised temperature)  Theses side effects should settle down within a day or two.  If they don't get better contact your doctor.  Drinking lots of fluids can help flush the drug out of your bladder and reduce some of these effects.  Taking Ibuprofen or Aleve is encouraged unless you have a condition that would make these medications unsafe to take (renal failure, diabetes, gerd)  Rare side effects can include a continuing high temperature (fever), pain in your joints and a cough.  If you have any of these symptoms, or if you feel generally unwell, contact your doctor.  These symptoms could be a sign of a more serious infection (due to BCG) that needs to be treated immediately.  If this happens you'll be treated with the same drugs (antibiotics) that are used to treat TB.  Contraception Men should use a condom during sex for  the first 48 hours after their treatment.  If you are a women who has had BCG treatment then your partner should use a condom.  Using a condom will protect your partner from any vaccine present in your semen or vaginal fluid.  We don't know how BCG may affect a developing fetus so it's not advisable to become pregnant or father a child while having it.  It is important to use effective contraception during your treatment and for six weeks afterwards.  You can discuss this with your doctor or specialist nurse.

## 2023-05-07 ENCOUNTER — Ambulatory Visit: Payer: Medicare Other | Admitting: Physician Assistant

## 2023-05-07 ENCOUNTER — Encounter: Payer: Self-pay | Admitting: Physician Assistant

## 2023-05-14 ENCOUNTER — Telehealth: Payer: Self-pay | Admitting: Pharmacy Technician

## 2023-05-14 ENCOUNTER — Ambulatory Visit: Payer: Medicare Other | Admitting: Physician Assistant

## 2023-05-14 VITALS — BP 137/80 | HR 88 | Ht 65.0 in | Wt 186.0 lb

## 2023-05-14 DIAGNOSIS — D494 Neoplasm of unspecified behavior of bladder: Secondary | ICD-10-CM | POA: Diagnosis not present

## 2023-05-14 DIAGNOSIS — C679 Malignant neoplasm of bladder, unspecified: Secondary | ICD-10-CM

## 2023-05-14 LAB — URINALYSIS, COMPLETE
Bilirubin, UA: NEGATIVE
Glucose, UA: NEGATIVE
Ketones, UA: NEGATIVE
Leukocytes,UA: NEGATIVE
Nitrite, UA: NEGATIVE
Protein,UA: NEGATIVE
RBC, UA: NEGATIVE
Specific Gravity, UA: >1.03 — ABNORMAL HIGH (ref 1.005–1.030)
Urobilinogen, Ur: 0.2 mg/dL (ref 0.2–1.0)
pH, UA: 5.5 (ref 5.0–7.5)

## 2023-05-14 LAB — MICROSCOPIC EXAMINATION

## 2023-05-14 MED ORDER — BCG LIVE 50 MG IS SUSR
3.2400 mL | Freq: Once | INTRAVESICAL | Status: AC
  Administered 2023-05-14: 81 mg via INTRAVESICAL

## 2023-05-14 NOTE — Telephone Encounter (Signed)
 Auth Submission: NO AUTH NEEDED Site of care: [facility] Payer: [employer] MEDICARE Medication & CPT/J Code(s) submitted:  BCG Route of submission (phone, fax, portal):  Phone # Fax # Auth type: Buy/Bill PB Units/visits requested:  Reference number: [id number] Approval from:  to

## 2023-05-14 NOTE — Progress Notes (Signed)
 BCG Bladder Instillation  BCG # 2 of 6  Due to Bladder Cancer patient is present today for a BCG treatment. Patient was cleaned and prepped in a sterile fashion with betadine. A 14FR catheter was inserted, urine return was noted 25ml, urine was yellow in color.  50ml of reconstituted BCG was instilled into the bladder. The catheter was then removed. Patient tolerated well, no complications were noted  Performed by: Meris Reede, PA-C   Follow up/ Additional notes: 1 week for BCG #3

## 2023-05-21 ENCOUNTER — Ambulatory Visit: Payer: Medicare Other | Admitting: Physician Assistant

## 2023-05-21 VITALS — BP 164/72 | HR 103

## 2023-05-21 DIAGNOSIS — C672 Malignant neoplasm of lateral wall of bladder: Secondary | ICD-10-CM | POA: Diagnosis not present

## 2023-05-21 DIAGNOSIS — D494 Neoplasm of unspecified behavior of bladder: Secondary | ICD-10-CM

## 2023-05-21 LAB — MICROSCOPIC EXAMINATION: Epithelial Cells (non renal): >10 /[HPF] — AB (ref 0–10)

## 2023-05-21 LAB — URINALYSIS, COMPLETE
Bilirubin, UA: NEGATIVE
Glucose, UA: NEGATIVE
Ketones, UA: NEGATIVE
Leukocytes,UA: NEGATIVE
Nitrite, UA: NEGATIVE
Protein,UA: NEGATIVE
RBC, UA: NEGATIVE
Specific Gravity, UA: 1.025 (ref 1.005–1.030)
Urobilinogen, Ur: 0.2 mg/dL (ref 0.2–1.0)
pH, UA: 5.5 (ref 5.0–7.5)

## 2023-05-21 MED ORDER — BCG LIVE 50 MG IS SUSR
3.2400 mL | Freq: Once | INTRAVESICAL | Status: AC
  Administered 2023-05-21: 81 mg via INTRAVESICAL

## 2023-05-21 NOTE — Progress Notes (Signed)
BCG Bladder Instillation  BCG # 3 of 6  Due to Bladder Cancer patient is present today for a BCG treatment. Patient was cleaned and prepped in a sterile fashion with betadine. A 14FR catheter was inserted, urine return was noted 10ml, urine was yellow in color.  50ml of reconstituted BCG was instilled into the bladder. The catheter was then removed. Patient tolerated well, no complications were noted  Performed by: Carman Ching, PA-C   Follow up: 1 week

## 2023-05-28 ENCOUNTER — Ambulatory Visit: Payer: Medicare Other | Admitting: Physician Assistant

## 2023-05-28 DIAGNOSIS — C679 Malignant neoplasm of bladder, unspecified: Secondary | ICD-10-CM

## 2023-05-28 DIAGNOSIS — D494 Neoplasm of unspecified behavior of bladder: Secondary | ICD-10-CM

## 2023-05-28 LAB — MICROSCOPIC EXAMINATION

## 2023-05-28 LAB — URINALYSIS, COMPLETE
Bilirubin, UA: NEGATIVE
Glucose, UA: NEGATIVE
Ketones, UA: NEGATIVE
Leukocytes,UA: NEGATIVE
Nitrite, UA: NEGATIVE
Protein,UA: NEGATIVE
RBC, UA: NEGATIVE
Specific Gravity, UA: >1.03 — ABNORMAL HIGH (ref 1.005–1.030)
Urobilinogen, Ur: 0.2 mg/dL (ref 0.2–1.0)
pH, UA: 5.5 (ref 5.0–7.5)

## 2023-05-28 MED ORDER — BCG LIVE 50 MG IS SUSR
3.2400 mL | Freq: Once | INTRAVESICAL | Status: AC
  Administered 2023-05-28: 81 mg via INTRAVESICAL

## 2023-05-28 NOTE — Progress Notes (Signed)
 BCG Bladder Instillation  BCG # 4 of 6  Due to Bladder Cancer patient is present today for a BCG treatment. Patient was cleaned and prepped in a sterile fashion with betadine. A 14FR catheter was inserted, urine return was noted 10ml, urine was yellow in color.  50ml of reconstituted BCG was instilled into the bladder. The catheter was then removed. Patient tolerated well, no complications were noted  Performed by: Carman Ching, PA-C and Ples Specter, CMA  Follow up: 1 week for BCG #5 of 6

## 2023-06-04 ENCOUNTER — Ambulatory Visit: Payer: Medicare Other | Admitting: Physician Assistant

## 2023-06-04 ENCOUNTER — Encounter: Payer: Self-pay | Admitting: Physician Assistant

## 2023-06-04 VITALS — BP 155/82 | HR 79 | Ht 66.0 in | Wt 180.0 lb

## 2023-06-04 DIAGNOSIS — C672 Malignant neoplasm of lateral wall of bladder: Secondary | ICD-10-CM | POA: Diagnosis not present

## 2023-06-04 LAB — URINALYSIS, COMPLETE
Bilirubin, UA: NEGATIVE
Glucose, UA: NEGATIVE
Ketones, UA: NEGATIVE
Leukocytes,UA: NEGATIVE
Nitrite, UA: NEGATIVE
Protein,UA: NEGATIVE
RBC, UA: NEGATIVE
Specific Gravity, UA: 1.02 (ref 1.005–1.030)
Urobilinogen, Ur: 0.2 mg/dL (ref 0.2–1.0)
pH, UA: 6 (ref 5.0–7.5)

## 2023-06-04 LAB — MICROSCOPIC EXAMINATION: Bacteria, UA: NONE SEEN

## 2023-06-04 MED ORDER — BCG LIVE 50 MG IS SUSR
3.2400 mL | Freq: Once | INTRAVESICAL | Status: AC
  Administered 2023-06-04: 81 mg via INTRAVESICAL

## 2023-06-04 NOTE — Progress Notes (Signed)
 BCG Bladder Instillation  BCG # 5 of 6  Due to Bladder Cancer patient is present today for a BCG treatment. Patient was cleaned and prepped in a sterile fashion with betadine. A 14FR catheter was inserted, urine return was noted 25ml, urine was yellow in color.  50ml of reconstituted BCG was instilled into the bladder. The catheter was then removed. Patient tolerated well, no complications were noted  Performed by: Carman Ching, PA-C and Benay Pike, CMA  Follow up: 1 week

## 2023-06-11 ENCOUNTER — Ambulatory Visit: Payer: Medicare Other | Admitting: Physician Assistant

## 2023-06-11 DIAGNOSIS — D494 Neoplasm of unspecified behavior of bladder: Secondary | ICD-10-CM

## 2023-06-11 DIAGNOSIS — C679 Malignant neoplasm of bladder, unspecified: Secondary | ICD-10-CM

## 2023-06-11 LAB — URINALYSIS, COMPLETE
Bilirubin, UA: NEGATIVE
Glucose, UA: NEGATIVE
Ketones, UA: NEGATIVE
Leukocytes,UA: NEGATIVE
Nitrite, UA: NEGATIVE
Protein,UA: NEGATIVE
Specific Gravity, UA: >1.03 — ABNORMAL HIGH (ref 1.005–1.030)
Urobilinogen, Ur: 0.2 mg/dL (ref 0.2–1.0)
pH, UA: 5 (ref 5.0–7.5)

## 2023-06-11 LAB — MICROSCOPIC EXAMINATION: Epithelial Cells (non renal): >10 /HPF — AB (ref 0–10)

## 2023-06-11 MED ORDER — BCG LIVE 50 MG IS SUSR
3.2400 mL | Freq: Once | INTRAVESICAL | Status: AC
  Administered 2023-06-11: 81 mg via INTRAVESICAL

## 2023-06-11 NOTE — Progress Notes (Signed)
 BCG Bladder Instillation  BCG # 6 of 6  Due to Bladder Cancer patient is present today for a BCG treatment. Patient was cleaned and prepped in a sterile fashion with betadine. A 14FR catheter was inserted, urine return was noted 25ml, urine was yellow in color.  50ml of reconstituted BCG was instilled into the bladder. The catheter was then removed. Patient tolerated well, no complications were noted  Performed by: Carman Ching, PA-C and Ples Specter, CMA  Follow up: Return in about 4 weeks (around 07/09/2023) for Cysto with Dr. Richardo Hanks.

## 2023-06-27 ENCOUNTER — Other Ambulatory Visit: Payer: Self-pay | Admitting: Urology

## 2023-07-08 ENCOUNTER — Ambulatory Visit: Admitting: Urology

## 2023-07-08 ENCOUNTER — Encounter: Payer: Self-pay | Admitting: Urology

## 2023-07-08 VITALS — BP 128/77 | HR 89 | Ht 66.0 in | Wt 180.0 lb

## 2023-07-08 DIAGNOSIS — C672 Malignant neoplasm of lateral wall of bladder: Secondary | ICD-10-CM

## 2023-07-08 DIAGNOSIS — D494 Neoplasm of unspecified behavior of bladder: Secondary | ICD-10-CM

## 2023-07-08 DIAGNOSIS — R310 Gross hematuria: Secondary | ICD-10-CM | POA: Diagnosis not present

## 2023-07-08 NOTE — Progress Notes (Signed)
 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 Cystoscopy was performed with the a flexible cystoscope. The urethra, bladder neck and entire bladder was visualized in a standard fashion, and bladder mucosa was grossly normal throughout. The ureteral orifices were visualized in their normal location and orientation.  No abnormalities on retroflexion   Plan: No evidence of recurrence today, continue BCG with maintenance at least 1 year RTC 3 to 4 months cystoscopy

## 2023-07-08 NOTE — Progress Notes (Signed)
 c

## 2023-07-25 ENCOUNTER — Ambulatory Visit

## 2023-07-30 ENCOUNTER — Other Ambulatory Visit: Payer: Self-pay | Admitting: Internal Medicine

## 2023-07-30 MED ORDER — ONETOUCH VERIO VI STRP: ORAL_STRIP | 3 refills | Status: DC

## 2023-07-30 NOTE — Telephone Encounter (Signed)
 Copied from CRM [id number]. Topic: Clinical - Medication Refill >> Jul 30, 2023  3:49 PM Lovett Ruck C wrote: Most Recent Primary Care Visit:  Provider: Curt Dover I  Department: [facility]  Visit Type: OFFICE VISIT  Date: 03/11/2023  Medication: glucose blood (ONETOUCH VERIO) test strip [[id number]]  Has the patient contacted their pharmacy? No (Agent: If no, request that the patient contact the pharmacy for the refill. If patient does not wish to contact the pharmacy document the reason why and proceed with request.) (Agent: If yes, when and what did the pharmacy advise?)  Is this the correct pharmacy for this prescription? Yes If no, delete pharmacy and type the correct one.  This is the patient's preferred pharmacy:  [facility] [address], [place] - [address] [place] [address] Phone: [phone] Fax: [phone]   Has the prescription been filled recently? No  Is the patient out of the medication? No- patient stated she only has a few left  Has the patient been seen for an appointment in the last year OR does the patient have an upcoming appointment? Yes  Can we respond through MyChart? No  Agent: Please be advised that Rx refills may take up to 3 business days. We ask that you follow-up with your pharmacy.

## 2023-08-13 ENCOUNTER — Ambulatory Visit: Admitting: Physician Assistant

## 2023-08-13 VITALS — BP 142/71 | HR 68 | Ht 65.0 in | Wt 180.0 lb

## 2023-08-13 DIAGNOSIS — C679 Malignant neoplasm of bladder, unspecified: Secondary | ICD-10-CM | POA: Diagnosis not present

## 2023-08-13 DIAGNOSIS — D494 Neoplasm of unspecified behavior of bladder: Secondary | ICD-10-CM | POA: Diagnosis not present

## 2023-08-13 LAB — URINALYSIS, COMPLETE
Bilirubin, UA: NEGATIVE
Glucose, UA: NEGATIVE
Ketones, UA: NEGATIVE
Leukocytes,UA: NEGATIVE
Nitrite, UA: NEGATIVE
Protein,UA: NEGATIVE
RBC, UA: NEGATIVE
Specific Gravity, UA: 1.02 (ref 1.005–1.030)
Urobilinogen, Ur: 0.2 mg/dL (ref 0.2–1.0)
pH, UA: 6 (ref 5.0–7.5)

## 2023-08-13 LAB — MICROSCOPIC EXAMINATION: Epithelial Cells (non renal): >10 /HPF — AB (ref 0–10)

## 2023-08-13 MED ORDER — BCG LIVE 50 MG IS SUSR
3.2400 mL | Freq: Once | INTRAVESICAL | Status: AC
  Administered 2023-08-13: 81 mg via INTRAVESICAL

## 2023-08-13 NOTE — Progress Notes (Signed)
 BCG Bladder Instillation  BCG # 1 of 3  Due to Bladder Cancer patient is present today for a BCG treatment. Patient was cleaned and prepped in a sterile fashion with betadine. A 14FR catheter was inserted, urine return was noted 25ml, urine was yellow in color.  50ml of reconstituted BCG was instilled into the bladder. The catheter was then removed. Patient tolerated well, no complications were noted  Performed by: Ghina Bittinger, PA-C and Jenny Mohs, CMA  Follow up: 1 week

## 2023-08-15 ENCOUNTER — Telehealth: Payer: Self-pay

## 2023-08-15 NOTE — Telephone Encounter (Signed)
 Auth Submission: APPROVED Site of care: [facility] Payer: [facility] medicare Medication & CPT/J Code(s) submitted: BCG Route of submission (phone, fax, portal): phone Phone # [phone] Fax # Auth type:  Units/visits requested:  Reference number: [id number] Approval from: 08/09/23 to 08/08/24   Approval letter in media tab

## 2023-08-20 ENCOUNTER — Ambulatory Visit: Admitting: Physician Assistant

## 2023-08-20 ENCOUNTER — Ambulatory Visit (INDEPENDENT_AMBULATORY_CARE_PROVIDER_SITE_OTHER)

## 2023-08-20 VITALS — Ht 65.0 in | Wt 180.0 lb

## 2023-08-20 DIAGNOSIS — D494 Neoplasm of unspecified behavior of bladder: Secondary | ICD-10-CM

## 2023-08-20 DIAGNOSIS — C679 Malignant neoplasm of bladder, unspecified: Secondary | ICD-10-CM

## 2023-08-20 DIAGNOSIS — Z0000 Encounter for general adult medical examination without abnormal findings: Secondary | ICD-10-CM

## 2023-08-20 DIAGNOSIS — E119 Type 2 diabetes mellitus without complications: Secondary | ICD-10-CM | POA: Diagnosis not present

## 2023-08-20 DIAGNOSIS — H9190 Unspecified hearing loss, unspecified ear: Secondary | ICD-10-CM

## 2023-08-20 LAB — MICROSCOPIC EXAMINATION: Bacteria, UA: NONE SEEN

## 2023-08-20 LAB — URINALYSIS, COMPLETE
Bilirubin, UA: NEGATIVE
Glucose, UA: NEGATIVE
Ketones, UA: NEGATIVE
Leukocytes,UA: NEGATIVE
Nitrite, UA: NEGATIVE
Protein,UA: NEGATIVE
RBC, UA: NEGATIVE
Specific Gravity, UA: <1.005 — ABNORMAL LOW (ref 1.005–1.030)
Urobilinogen, Ur: 0.2 mg/dL (ref 0.2–1.0)
pH, UA: 6 (ref 5.0–7.5)

## 2023-08-20 MED ORDER — BCG LIVE 50 MG IS SUSR
3.2400 mL | Freq: Once | INTRAVESICAL | Status: AC
  Administered 2023-08-20: 81 mg via INTRAVESICAL

## 2023-08-20 NOTE — Progress Notes (Signed)
 Please attest and cosign this visit due to patients primary care provider not being in the office at the time the visit was completed.   Subjective:   Monica Harrington is a 78 y.o. who presents for a Medicare Wellness preventive visit.  As a reminder, Annual Wellness Visits don't include a physical exam, and some assessments may be limited, especially if this visit is performed virtually. We may recommend an in-person visit if needed.  Visit Complete: Virtual I connected with  Monica Harrington on 08/20/23 by a audio enabled telemedicine application and verified that I am speaking with the correct person using two identifiers.  Patient Location: Home  Provider Location: Office/Clinic  I discussed the limitations of evaluation and management by telemedicine. The patient expressed understanding and agreed to proceed.  Vital Signs: Because this visit was a virtual/telehealth visit, some criteria may be missing or patient reported. Any vitals not documented were not able to be obtained and vitals that have been documented are patient reported.  VideoDeclined- This patient declined Librarian, academic. Therefore the visit was completed with audio only.  Persons Participating in Visit: Patient.  AWV Questionnaire: No: Patient Medicare AWV questionnaire was not completed prior to this visit.  Cardiac Risk Factors include: advanced age (>66men, >29 women);dyslipidemia;diabetes mellitus;hypertension     Objective:     Today's Vitals   08/20/23 1351  Weight: 180 lb (81.6 kg)  Height: 5\' 5"  (1.651 m)   Body mass index is 29.95 kg/m.     08/20/2023    2:01 PM 03/22/2023    7:41 AM 03/19/2023   11:55 AM 10/25/2022    2:24 PM 09/28/2022   11:39 AM 09/26/2022    3:17 PM 09/22/2022   12:00 PM  Advanced Directives  Does Patient Have a Medical Advance Directive? No No No No No No No  Would patient like information on creating a medical advance directive?  No -  Patient declined No - Patient declined  No - Patient declined  No - Patient declined    Current Medications (verified) Outpatient Encounter Medications as of 08/20/2023  Medication Sig   atorvastatin  (LIPITOR) 20 MG tablet TAKE 1 TABLET BY MOUTH EVERY DAY   glipiZIDE  (GLUCOTROL  XL) 5 MG 24 hr tablet TAKE 1 TABLET BY MOUTH EVERY DAY WITH BREAKFAST   glucose blood (ONETOUCH VERIO) test strip Use to check blood sugar once a day Dx Code E11.9   Lancets (ONETOUCH DELICA PLUS LANCET33G) MISC CHECK BLOOD SUGAR ONCE DAILY. DX E11.9   losartan -hydrochlorothiazide (HYZAAR) 50-12.5 MG tablet TAKE 1 TABLET BY MOUTH EVERY DAY   metFORMIN  (GLUCOPHAGE ) 1000 MG tablet TAKE 1 TABLET (1,000 MG TOTAL) BY MOUTH TWICE A DAY WITH FOOD   Multiple Vitamins-Minerals (CENTRUM SILVER ULTRA WOMENS PO) Take 1 tablet by mouth daily.   pantoprazole  (PROTONIX ) 40 MG tablet TAKE 1 TABLET BY MOUTH EVERY DAY   No facility-administered encounter medications on file as of 08/20/2023.    Allergies (verified) Patient has no known allergies.   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   Cancer [facility])    Diabetes mellitus, type 2 (HCC)    GERD (gastroesophageal reflux disease)    Hyperlipidemia    Hypertension    Osteoarthritis of both shoulders    Past Surgical History:  Procedure Laterality Date   BLADDER INSTILLATION N/A 09/28/2022   Procedure: BLADDER INSTILLATION OF GEMCITABINE ;  Surgeon: Lawerence Pressman, MD;  Location: [facility] ORS;  Service:  Urology;  Laterality: N/A;   BLADDER INSTILLATION N/A 03/22/2023   Procedure: BLADDER INSTILLATION OF GEMCITABINE ;  Surgeon: Lawerence Pressman, MD;  Location: [facility] ORS;  Service: Urology;  Laterality: N/A;   BREAST BIOPSY Left 05/17/2016   neg   COLONOSCOPY WITH PROPOFOL  N/A 04/29/2018   Procedure: COLONOSCOPY WITH PROPOFOL ;  Surgeon: Toledo, Alphonsus Jeans, MD;  Location: [facility] ENDOSCOPY;  Service: Gastroenterology;  Laterality: N/A;   CYSTOSCOPY WITH  BIOPSY N/A 03/22/2023   Procedure: CYSTOSCOPY WITH BLADDER BIOPSY;  Surgeon: Lawerence Pressman, MD;  Location: [facility] ORS;  Service: Urology;  Laterality: N/A;   FOOT FRACTURE SURGERY Right 11/2006   TRANSURETHRAL RESECTION OF BLADDER TUMOR N/A 09/28/2022   Procedure: TRANSURETHRAL RESECTION OF BLADDER TUMOR (TURBT);  Surgeon: Lawerence Pressman, MD;  Location: [facility] ORS;  Service: Urology;  Laterality: N/A;   TRANSURETHRAL RESECTION OF BLADDER TUMOR N/A 11/02/2022   Procedure: TRANSURETHRAL RESECTION OF BLADDER TUMOR (TURBT);  Surgeon: Lawerence Pressman, MD;  Location: [facility] ORS;  Service: Urology;  Laterality: N/A;   Family History  Problem Relation Age of Onset   Alcohol abuse Father    Hypertension Sister    Hypertension Sister    Cancer Neg Hx    Diabetes Neg Hx    Heart disease Neg Hx    Social History   Socioeconomic History   Marital status: Widowed    Spouse name: Not on file   Number of children: Not on file   Years of education: Not on file   Highest education level: Not on file  Occupational History   Occupation: Housekeeping    Employer: [employer]    Comment: Retired  Tobacco Use   Smoking status: Former    Current packs/day: 0.00    Types: Cigarettes    Quit date: 01/07/2004    Years since quitting: 19.6    Passive exposure: Never   Smokeless tobacco: Never  Vaping Use   Vaping status: Never Used  Substance and Sexual Activity   Alcohol use: Not Currently    Comment:     Drug use: Never   Sexual activity: Not on file  Other Topics Concern   Not on file  Social History Narrative   Widowed ~2014      No living will   Would want sister to make health care decisions--- Monica Harrington   Would accept resuscitation--but no prolonged life support   Not sure about tube feeds--but wouldn't want if cognitively unaware      Lives at home with sister.   Social Drivers of Corporate investment banker Strain: Low Risk  (08/20/2023)   Overall Financial Resource Strain  (CARDIA)    Difficulty of Paying Living Expenses: Not hard at all  Food Insecurity: No Food Insecurity (08/20/2023)   Hunger Vital Sign    Worried About Running Out of Food in the Last Year: Never true    Ran Out of Food in the Last Year: Never true  Transportation Needs: No Transportation Needs (08/20/2023)   PRAPARE - Administrator, Civil Service (Medical): No    Lack of Transportation (Non-Medical): No  Physical Activity: Insufficiently Active (08/20/2023)   Exercise Vital Sign    Days of Exercise per Week: 2 days    Minutes of Exercise per Session: 30 min  Stress: No Stress Concern Present (08/20/2023)   [employer] of Occupational Health - Occupational Stress Questionnaire    Feeling of Stress : Not at all  Social Connections: Socially Isolated (  08/20/2023)   Social Connection and Isolation Panel [NHANES]    Frequency of Communication with Friends and Family: More than three times a week    Frequency of Social Gatherings with Friends and Family: More than three times a week    Attends Religious Services: Never    Database administrator or Organizations: No    Attends Banker Meetings: Never    Marital Status: Widowed    Tobacco Counseling Counseling given: Not Answered    Clinical Intake:  Pre-visit preparation completed: Yes  Pain : No/denies pain     BMI - recorded: 29.95 Nutritional Status: BMI 25 -29 Overweight Nutritional Risks: None Diabetes: Yes CBG done?: Yes (BS 89 yesterday morning) CBG resulted in Enter/ Edit results?: No Did pt. bring in CBG monitor from home?: No  Lab Results  Component Value Date   HGBA1C 7.6 (A) 03/11/2023   HGBA1C 7.7 (A) 09/05/2022   HGBA1C 8.5 (H) 03/07/2022     How often do you need to have someone help you when you read instructions, pamphlets, or other written materials from your doctor or pharmacy?: 1 - Never  Interpreter Needed?: No  Comments: lives with sister and niece Information  entered by :: B.Ayinde Swim,LPN   Activities of Daily Living     08/20/2023    2:01 PM 03/19/2023   11:58 AM  In your present state of health, do you have any difficulty performing the following activities:  Hearing? 0   Vision? 0   Difficulty concentrating or making decisions? 0   Walking or climbing stairs? 0   Dressing or bathing? 0   Doing errands, shopping? 0 0  Preparing Food and eating ? N   Using the Toilet? N   In the past six months, have you accidently leaked urine? N   Do you have problems with loss of bowel control? N   Managing your Medications? N   Managing your Finances? N   Housekeeping or managing your Housekeeping? N     Patient Care Team: Helaine Llanos, MD as PCP - [facility] Carmelia China, [facility] (Inactive) as Pharmacist (Pharmacist)  Indicate any recent Medical Services you may have received from other than [facility] providers in the past year (date may be approximate).     Assessment:    This is a routine wellness examination for Darene.  Hearing/Vision screen Hearing Screening - Comments:: Pt says her hearing is not good Audiology referral Vision Screening - Comments:: Pt says her vision is good Eye referral-[facility]   Goals Addressed             This Visit's Progress    Patient Stated       I haven't really thought about it       Depression Screen     08/20/2023    1:58 PM 03/11/2023    9:06 AM 03/07/2022    9:01 AM 02/17/2021    8:34 AM 02/17/2020    8:18 AM 02/13/2019    9:52 AM 02/10/2018   12:07 PM  PHQ 2/9 Scores  PHQ - 2 Score 0 0 0 0 0 0 0    Fall Risk     08/20/2023    1:55 PM 03/11/2023    9:06 AM 03/07/2022    9:01 AM 02/17/2021    8:34 AM 02/17/2020    8:17 AM  Fall Risk   Falls in the past year? 0 0 0 0 0  Number falls in past yr: 0  0   0  Injury with Fall? 0 0   0  Risk for fall due to : No Fall Risks No Fall Risks     Follow up Education provided;Falls prevention discussed Falls evaluation completed        MEDICARE RISK AT HOME:  Medicare Risk at Home Any stairs in or around the home?: No If so, are there any without handrails?: No Home free of loose throw rugs in walkways, pet beds, electrical cords, etc?: Yes Adequate lighting in your home to reduce risk of falls?: Yes Life alert?: Yes Use of a cane, walker or w/c?: No Grab bars in the bathroom?: Yes Shower chair or bench in shower?: No Elevated toilet seat or a handicapped toilet?: No  TIMED UP AND GO:  Was the test performed?  No  Cognitive Function: 6CIT completed        08/20/2023    2:02 PM  6CIT Screen  What Year? 0 points  What month? 0 points  What time? 0 points  Count back from 20 0 points  Months in reverse 0 points  Repeat phrase 8 points  Total Score 8 points    Immunizations Immunization History  Administered Date(s) Administered   Fluad Quad(high Dose 65+) 02/13/2019, 02/17/2020, 02/17/2021, 03/07/2022   Fluad Trivalent(High Dose 65+) 03/11/2023   Influenza,inj,Quad PF,6+ Mos 12/13/2015, 02/13/2017, 02/10/2018   Influenza-Unspecified 12/22/2013   PFIZER(Purple Top)SARS-COV-2 Vaccination 07/23/2019, 08/18/2019   Pneumococcal Conjugate-13 06/25/2014   Pneumococcal Polysaccharide-23 08/10/2015   Td 03/12/2007   Tdap 08/24/2016    Screening Tests Health Maintenance  Topic Date Due   Hepatitis C Screening  Never done   Zoster Vaccines- Shingrix (1 of 2) Never done   DEXA SCAN  Never done   OPHTHALMOLOGY EXAM  07/11/2017   Diabetic kidney evaluation - Urine ACR  03/08/2023   HEMOGLOBIN A1C  09/09/2023   INFLUENZA VACCINE  11/08/2023   FOOT EXAM  03/10/2024   Diabetic kidney evaluation - eGFR measurement  03/18/2024   Medicare Annual Wellness (AWV)  08/19/2024   DTaP/Tdap/Td (3 - Td or Tdap) 08/25/2026   Colonoscopy  04/29/2028   Pneumonia Vaccine 65+ Years old  Completed   HPV VACCINES  Aged Out   Meningococcal B Vaccine  Aged Out   COVID-19 Vaccine  Discontinued    Health  Maintenance  Health Maintenance Due  Topic Date Due   Hepatitis C Screening  Never done   Zoster Vaccines- Shingrix (1 of 2) Never done   DEXA SCAN  Never done   OPHTHALMOLOGY EXAM  07/11/2017   Diabetic kidney evaluation - Urine ACR  03/08/2023   Health Maintenance Items Addressed: Referral sent to Optometry/Ophthalmology, Referral sent for Diabetic Retinal Eye Exam  Additional Screening:  Vision Screening: Recommended annual ophthalmology exams for early detection of glaucoma and other disorders of the eye.  Dental Screening: Recommended annual dental exams for proper oral hygiene  Community Resource Referral / Chronic Care Management: CRR required this visit?  No   CCM required this visit?  No   Plan:    I have personally reviewed and noted the following in the patient's chart:   Medical and social history Use of alcohol, tobacco or illicit drugs  Current medications and supplements including opioid prescriptions. Patient is not currently taking opioid prescriptions. Functional ability and status Nutritional status Physical activity Advanced directives List of other physicians Hospitalizations, surgeries, and ER visits in previous 12 months Vitals Screenings to include cognitive, depression, and falls Referrals and  appointments  In addition, I have reviewed and discussed with patient certain preventive protocols, quality metrics, and best practice recommendations. A written personalized care plan for preventive services as well as general preventive health recommendations were provided to patient.   Nerissa Bannister, LPN   1/61/0960   After Visit Summary: (Declined) Due to this being a telephonic visit, with patients personalized plan was offered to patient but patient Declined AVS at this time   Notes: Nothing significant to report at this time.

## 2023-08-20 NOTE — Progress Notes (Signed)
 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 Patient was cleaned and prepped in a sterile fashion with betadine. A 14FR catheter was inserted, urine return was noted , urine was yellow in color.  50ml of reconstituted BCG was instilled into the bladder. The catheter was then removed. Patient tolerated well, no complications were noted   Performed by: Braylee Lal, PA-C and Jessica Qualls CMA   Follow up: 1 week

## 2023-08-20 NOTE — Patient Instructions (Signed)
 Monica Harrington , Thank you for taking time out of your busy schedule to complete your Annual Wellness Visit with me. I enjoyed our conversation and look forward to speaking with you again next year. I, as well as your care team,  appreciate your ongoing commitment to your health goals. Please review the following plan we discussed and let me know if I can assist you in the future. Your Game plan/ To Do List    Referrals: If you haven't heard from the office you've been referred to, please reach out to them at the phone provided.  You have been referred to establish care with a new eye care provider. If you have not heard from them in the next week, please call to schedule your appointment  [facility] [address] [place] [address] Ph [phone]   Patient complains of difficulty with hearing. ENT referral placed. Patient is in agreement with treatment plan. Aware that the office will call with an appointment.   Follow up Visits: Next Medicare AWV with our clinical staff: 08/20/24 @ 2:20pm   Have you seen your provider in the last 6 months (3 months if uncontrolled diabetes)? Yes Next Office Visit with your provider: 09/10/23  Clinician Recommendations:  Aim for 30 minutes of exercise or brisk walking, 6-8 glasses of water , and 5 servings of fruits and vegetables each day.       This is a list of the screening recommended for you and due dates:  Health Maintenance  Topic Date Due   Hepatitis C Screening  Never done   Zoster (Shingles) Vaccine (1 of 2) Never done   DEXA scan (bone density measurement)  Never done   Eye exam for diabetics  07/11/2017   Yearly kidney health urinalysis for diabetes  03/08/2023   Hemoglobin A1C  09/09/2023   Flu Shot  11/08/2023   Complete foot exam   03/10/2024   Yearly kidney function blood test for diabetes  03/18/2024   Medicare Annual Wellness Visit  08/19/2024   DTaP/Tdap/Td vaccine (3 - Td or Tdap) 08/25/2026   Colon Cancer  Screening  04/29/2028   Pneumonia Vaccine  Completed   HPV Vaccine  Aged Out   Meningitis B Vaccine  Aged Out   COVID-19 Vaccine  Discontinued    Advanced directives: (Declined) Advance directive discussed with you today. Even though you declined this today, please call our office should you change your mind, and we can give you the proper paperwork for you to fill out. Advance Care Planning is important because it:  [x]  Makes sure you receive the medical care that is consistent with your values, goals, and preferences  [x]  It provides guidance to your family and loved ones and reduces their decisional burden about whether or not they are making the right decisions based on your wishes.  Follow the link provided in your after visit summary or read over the paperwork we have mailed to you to help you started getting your Advance Directives in place. If you need assistance in completing these, please reach out to us  so that we can help you!

## 2023-08-27 ENCOUNTER — Ambulatory Visit: Admitting: Physician Assistant

## 2023-08-27 VITALS — BP 129/79 | HR 71 | Ht 65.0 in | Wt 180.0 lb

## 2023-08-27 DIAGNOSIS — R310 Gross hematuria: Secondary | ICD-10-CM | POA: Diagnosis not present

## 2023-08-27 DIAGNOSIS — D494 Neoplasm of unspecified behavior of bladder: Secondary | ICD-10-CM

## 2023-08-27 LAB — URINALYSIS, COMPLETE
Bilirubin, UA: NEGATIVE
Glucose, UA: NEGATIVE
Ketones, UA: NEGATIVE
Leukocytes,UA: NEGATIVE
Nitrite, UA: NEGATIVE
Protein,UA: NEGATIVE
RBC, UA: NEGATIVE
Specific Gravity, UA: 1.02 (ref 1.005–1.030)
Urobilinogen, Ur: 0.2 mg/dL (ref 0.2–1.0)
pH, UA: 6 (ref 5.0–7.5)

## 2023-08-27 LAB — MICROSCOPIC EXAMINATION: Epithelial Cells (non renal): >10 /HPF — AB (ref 0–10)

## 2023-08-27 MED ORDER — BCG LIVE 50 MG IS SUSR
3.2400 mL | Freq: Once | INTRAVESICAL | Status: AC
  Administered 2023-08-27: 81 mg via INTRAVESICAL

## 2023-08-27 NOTE — Progress Notes (Signed)
 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 Patient was cleaned and prepped in a sterile fashion with betadine. A 14FR catheter was inserted, urine return was noted , urine was yellow in color.  50ml of reconstituted BCG was instilled into the bladder. The catheter was then removed. Patient tolerated well, no complications were noted  Performed by: Lucio Litsey, PA-C and Jola Nash, CMA  Follow up: July for cysto with Dr. Estanislao Heimlich

## 2023-09-10 ENCOUNTER — Encounter: Payer: Medicare Other | Admitting: Internal Medicine

## 2023-09-11 ENCOUNTER — Ambulatory Visit (INDEPENDENT_AMBULATORY_CARE_PROVIDER_SITE_OTHER): Admitting: Internal Medicine

## 2023-09-11 ENCOUNTER — Encounter: Payer: Self-pay | Admitting: Internal Medicine

## 2023-09-11 VITALS — BP 128/68 | HR 69 | Temp 98.0°F | Ht 64.75 in | Wt 185.0 lb

## 2023-09-11 DIAGNOSIS — Z1159 Encounter for screening for other viral diseases: Secondary | ICD-10-CM | POA: Diagnosis not present

## 2023-09-11 DIAGNOSIS — I10 Essential (primary) hypertension: Secondary | ICD-10-CM | POA: Diagnosis not present

## 2023-09-11 DIAGNOSIS — E1159 Type 2 diabetes mellitus with other circulatory complications: Secondary | ICD-10-CM | POA: Diagnosis not present

## 2023-09-11 DIAGNOSIS — Z0000 Encounter for general adult medical examination without abnormal findings: Secondary | ICD-10-CM | POA: Diagnosis not present

## 2023-09-11 DIAGNOSIS — Z7984 Long term (current) use of oral hypoglycemic drugs: Secondary | ICD-10-CM | POA: Diagnosis not present

## 2023-09-11 DIAGNOSIS — C679 Malignant neoplasm of bladder, unspecified: Secondary | ICD-10-CM

## 2023-09-11 LAB — COMPREHENSIVE METABOLIC PANEL WITH GFR
ALT: 35 U/L (ref 0–35)
AST: 34 U/L (ref 0–37)
Albumin: 4.4 g/dL (ref 3.5–5.2)
Alkaline Phosphatase: 63 U/L (ref 39–117)
BUN: 11 mg/dL (ref 6–23)
CO2: 29 meq/L (ref 19–32)
Calcium: 9.7 mg/dL (ref 8.4–10.5)
Chloride: 102 meq/L (ref 96–112)
Creatinine, Ser: 0.83 mg/dL (ref 0.40–1.20)
GFR: 68.65 mL/min (ref >60.00)
Glucose, Bld: 143 mg/dL — ABNORMAL HIGH (ref 70–99)
Potassium: 4 meq/L (ref 3.5–5.1)
Sodium: 138 meq/L (ref 135–145)
Total Bilirubin: 0.3 mg/dL (ref 0.2–1.2)
Total Protein: 8.2 g/dL (ref 6.0–8.3)

## 2023-09-11 LAB — LIPID PANEL
Cholesterol: 127 mg/dL (ref 0–200)
HDL: 40.4 mg/dL (ref >39.00)
LDL Cholesterol: 70 mg/dL (ref 0–99)
NonHDL: 86.47
Total CHOL/HDL Ratio: 3
Triglycerides: 81 mg/dL (ref 0.0–149.0)
VLDL: 16.2 mg/dL (ref 0.0–40.0)

## 2023-09-11 LAB — MICROALBUMIN / CREATININE URINE RATIO
Creatinine,U: 78.6 mg/dL
Microalb Creat Ratio: UNDETERMINED mg/g (ref 0.0–30.0)
Microalb, Ur: <0.7 mg/dL

## 2023-09-11 LAB — CBC
HCT: 38.6 % (ref 36.0–46.0)
Hemoglobin: 12.6 g/dL (ref 12.0–15.0)
MCHC: 32.8 g/dL (ref 30.0–36.0)
MCV: 87.9 fl (ref 78.0–100.0)
Platelets: 355 10*3/uL (ref 150.0–400.0)
RBC: 4.39 Mil/uL (ref 3.87–5.11)
RDW: 14.8 % (ref 11.5–15.5)
WBC: 8.1 10*3/uL (ref 4.0–10.5)

## 2023-09-11 LAB — HEMOGLOBIN A1C: Hgb A1c MFr Bld: 8.2 % — ABNORMAL HIGH (ref 4.6–6.5)

## 2023-09-11 LAB — HM DIABETES FOOT EXAM

## 2023-09-11 NOTE — Assessment & Plan Note (Signed)
 Doing well Discussed doing some exercise Done with screening colonoscopies Does plan to continue screening mammograms for a while Flu/COVID in the fall---RSV anytime Not sure about shingrix

## 2023-09-11 NOTE — Assessment & Plan Note (Signed)
 Repeat cysto due in July to see if further Rx needed

## 2023-09-11 NOTE — Progress Notes (Signed)
 Subjective:    Patient ID: Monica Harrington, female    DOB: 10-14-1945, 78 y.o.   MRN: [id number]  HPI Here for physical  Doing fine Has had repeated bladder instillations On hold now and then will get reevaluation in July  Sugars 80-125 after breakfast (2 hours) No low sugar reactions No foot numbness or burning Current Outpatient Medications on File Prior to Visit  Medication Sig Dispense Refill   atorvastatin  (LIPITOR) 20 MG tablet TAKE 1 TABLET BY MOUTH EVERY DAY 90 tablet 3   glipiZIDE  (GLUCOTROL  XL) 5 MG 24 hr tablet TAKE 1 TABLET BY MOUTH EVERY DAY WITH BREAKFAST 90 tablet 3   glucose blood (ONETOUCH VERIO) test strip Use to check blood sugar once a day Dx Code E11.9 100 each 3   Lancets (ONETOUCH DELICA PLUS LANCET33G) MISC CHECK BLOOD SUGAR ONCE DAILY. DX E11.9 100 each 7   losartan -hydrochlorothiazide (HYZAAR) 50-12.5 MG tablet TAKE 1 TABLET BY MOUTH EVERY DAY 90 tablet 3   metFORMIN  (GLUCOPHAGE ) 1000 MG tablet TAKE 1 TABLET (1,000 MG TOTAL) BY MOUTH TWICE A DAY WITH FOOD 180 tablet 3   Multiple Vitamins-Minerals (CENTRUM SILVER ULTRA WOMENS PO) Take 1 tablet by mouth daily.     pantoprazole  (PROTONIX ) 40 MG tablet TAKE 1 TABLET BY MOUTH EVERY DAY 90 tablet 3   No current facility-administered medications on file prior to visit.    No Known Allergies  Past Medical History:  Diagnosis Date   Bladder tumor 09/2022   Cancer [facility])    Diabetes mellitus, type 2 (HCC)    GERD (gastroesophageal reflux disease)    Hyperlipidemia    Hypertension    Osteoarthritis of both shoulders     Past Surgical History:  Procedure Laterality Date   BLADDER INSTILLATION N/A 09/28/2022   Procedure: BLADDER INSTILLATION OF GEMCITABINE ;  Surgeon: Lawerence Pressman, MD;  Location: [facility] ORS;  Service: Urology;  Laterality: N/A;   BLADDER INSTILLATION N/A 03/22/2023   Procedure: BLADDER INSTILLATION OF GEMCITABINE ;  Surgeon: Lawerence Pressman, MD;  Location: [facility] ORS;  Service: Urology;   Laterality: N/A;   BREAST BIOPSY Left 05/17/2016   neg   COLONOSCOPY WITH PROPOFOL  N/A 04/29/2018   Procedure: COLONOSCOPY WITH PROPOFOL ;  Surgeon: Toledo, Alphonsus Jeans, MD;  Location: [facility];  Service: Gastroenterology;  Laterality: N/A;   CYSTOSCOPY WITH BIOPSY N/A 03/22/2023   Procedure: CYSTOSCOPY WITH BLADDER BIOPSY;  Surgeon: Lawerence Pressman, MD;  Location: [facility] OR[facility];  Service: Urology;  Laterality: N/A;   FOOT FRACTURE SURGERY Right 11/2006   TRANSURETHRAL RESECTION OF BLADDER TUMOR N/A 09/28/2022   Procedure: TRANSURETHRAL RESECTION OF BLADDER TUMOR (TURBT);  Surgeon: Lawerence Pressman, MD;  Location: [facility] ORS;  Service: Urology;  Laterality: N/A;   TRANSURETHRAL RESECTION OF BLADDER TUMOR N/A 11/02/2022   Procedure: TRANSURETHRAL RESECTION OF BLADDER TUMOR (TURBT);  Surgeon: Lawerence Pressman, MD;  Location: [facility] OR[facility];  Service: Urology;  Laterality: N/A;    Family History  Problem Relation Age of Onset   Alcohol abuse Father    Hypertension Sister    Hypertension Sister    Cancer Neg Hx    Diabetes Neg Hx    Heart disease Neg Hx     Social History   Socioeconomic History   Marital status: Widowed    Spouse name: Not on file   Number of children: Not on file   Years of education: Not on file   Highest education level: Not on file  Occupational History  Occupation: Equities trader: [employer]    Comment: Retired  Tobacco Use   Smoking status: Former    Current packs/day: 0.00    Types: Cigarettes    Quit date: 01/07/2004    Years since quitting: 19.6    Passive exposure: Never   Smokeless tobacco: Never  Vaping Use   Vaping status: Never Used  Substance and Sexual Activity   Alcohol use: Not Currently    Comment:     Drug use: Never   Sexual activity: Not on file  Other Topics Concern   Not on file  Social History Narrative   Widowed ~2014      No living will   Would want sister to make health care decisions--- Devra Fontana   Would accept  resuscitation--but no prolonged life support   Not sure about tube feeds--but wouldn't want if cognitively unaware      Lives at home with sister.   Social Drivers of Corporate investment banker Strain: Low Risk  (08/20/2023)   Overall Financial Resource Strain (CARDIA)    Difficulty of Paying Living Expenses: Not hard at all  Food Insecurity: No Food Insecurity (08/20/2023)   Hunger Vital Sign    Worried About Running Out of Food in the Last Year: Never true    Ran Out of Food in the Last Year: Never true  Transportation Needs: No Transportation Needs (08/20/2023)   PRAPARE - Administrator, Civil Service (Medical): No    Lack of Transportation (Non-Medical): No  Physical Activity: Insufficiently Active (08/20/2023)   Exercise Vital Sign    Days of Exercise per Week: 2 days    Minutes of Exercise per Session: 30 min  Stress: No Stress Concern Present (08/20/2023)   [employer] of Occupational Health - Occupational Stress Questionnaire    Feeling of Stress : Not at all  Social Connections: Socially Isolated (08/20/2023)   Social Connection and Isolation Panel [NHANES]    Frequency of Communication with Friends and Family: More than three times a week    Frequency of Social Gatherings with Friends and Family: More than three times a week    Attends Religious Services: Never    Database administrator or Organizations: No    Attends Banker Meetings: Never    Marital Status: Widowed  Intimate Partner Violence: Not At Risk (08/20/2023)   Humiliation, Afraid, Rape, and Kick questionnaire    Fear of Current or Ex-Partner: No    Emotionally Abused: No    Physically Abused: No    Sexually Abused: No   Review of Systems  Constitutional:  Negative for fatigue and unexpected weight change.       Wears seat belt Not exercising  HENT:  Positive for tinnitus.        Needs audiology evaluation Needs dental extractions  Eyes:  Negative for visual disturbance.        No diplopia or unilateral vision loss  Respiratory:  Negative for cough, chest tightness and shortness of breath.   Cardiovascular:  Negative for chest pain, palpitations and leg swelling.  Gastrointestinal:  Negative for blood in stool and constipation.       No heartburn  Endocrine: Negative for polydipsia and polyuria.  Genitourinary:  Negative for dysuria and hematuria.  Musculoskeletal:  Negative for arthralgias and joint swelling.       Rare back pain--if in bed too long  Skin:  Negative for rash.  Allergic/Immunologic: Negative for environmental  allergies and immunocompromised state.  Neurological:  Negative for dizziness, syncope, light-headedness and headaches.  Hematological:  Negative for adenopathy. Does not bruise/bleed easily.  Psychiatric/Behavioral:  Negative for dysphoric mood and sleep disturbance. The patient is not nervous/anxious.        Objective:   Physical Exam Constitutional:      Appearance: Normal appearance.  HENT:     Mouth/Throat:     Pharynx: No oropharyngeal exudate or posterior oropharyngeal erythema.  Eyes:     Conjunctiva/sclera: Conjunctivae normal.     Pupils: Pupils are equal, round, and reactive to light.  Cardiovascular:     Rate and Rhythm: Normal rate and regular rhythm.     Pulses: Normal pulses.     Heart sounds: No murmur heard.    No gallop.  Pulmonary:     Effort: Pulmonary effort is normal.     Breath sounds: Normal breath sounds. No wheezing or rales.  Abdominal:     Palpations: Abdomen is soft.     Tenderness: There is no abdominal tenderness.  Musculoskeletal:     Cervical back: Neck supple.     Right lower leg: No edema.     Left lower leg: No edema.  Lymphadenopathy:     Cervical: No cervical adenopathy.  Skin:    Findings: No rash.     Comments: No foot lesions  Neurological:     General: No focal deficit present.     Mental Status: She is alert and oriented to person, place, and time.     Comments: Normal  sensation in feet  Psychiatric:        Mood and Affect: Mood normal.        Behavior: Behavior normal.            Assessment & Plan:

## 2023-09-11 NOTE — Patient Instructions (Signed)
 I recommend a one time RSV vaccine any time now (just once). Also, flu and COVID vaccines in the fall

## 2023-09-11 NOTE — Assessment & Plan Note (Signed)
 Seems to still have good control on glipizide  5mg  daily and metformin  1000 bid

## 2023-09-11 NOTE — Assessment & Plan Note (Signed)
 BP Readings from Last 3 Encounters:  09/11/23 128/68  08/27/23 129/79  08/13/23 (!) 142/71   Controlled on losartan /hydrochlorothiazide 50/12.5

## 2023-09-12 LAB — HEPATITIS C ANTIBODY: Hepatitis C Ab: NONREACTIVE

## 2023-09-16 ENCOUNTER — Ambulatory Visit: Admitting: Audiologist

## 2023-09-22 ENCOUNTER — Ambulatory Visit: Payer: Self-pay | Admitting: Internal Medicine

## 2023-09-27 ENCOUNTER — Telehealth: Payer: Self-pay

## 2023-09-27 NOTE — Telephone Encounter (Signed)
 Good Morning, This pt is wanting to transfer to someone at your office when Dr Tim Fontana in [place]. She will be due in late September for a 3 month diabetes follow-up. Is there a possibility that she can be scheduled with someone by then?

## 2023-10-01 DIAGNOSIS — H2513 Age-related nuclear cataract, bilateral: Secondary | ICD-10-CM | POA: Diagnosis not present

## 2023-10-01 LAB — HM DIABETES EYE EXAM

## 2023-10-02 ENCOUNTER — Other Ambulatory Visit: Payer: Self-pay | Admitting: Internal Medicine

## 2023-10-14 ENCOUNTER — Telehealth: Payer: Self-pay

## 2023-10-14 NOTE — Telephone Encounter (Signed)
 Patient was identified as falling into the [place] Measure - Diabetes.   Patient was: Attribution and/or data issue.  Validation/Investigation needed.  Explanation:  Patient has had recent A1c

## 2023-10-18 ENCOUNTER — Telehealth: Payer: Self-pay

## 2023-10-18 NOTE — Telephone Encounter (Signed)
 Copied from CRM [id number]. Topic: Clinical - Medical Advice >> Oct 18, 2023  3:49 PM Chasity T wrote: Reason for CRM: Patient is calling in  because she received a letter in the mail stating her insurance will allow her to get a new meter and she wanted to speak with nurse to get a prescription for it.

## 2023-10-18 NOTE — Telephone Encounter (Addendum)
 We received documentation from insurance already.    Patient received a letter in the mail from her insurance that her OneTouch diabetes supplies will no longer be covered starting August 3rd. The ones they will cover are:    Contour Plus Blue Blood Glucose Meter  Contour NEXT GEN Blood Glucose Meter Contour NEXT ONE Meter Contour NEXT EZ Meter    Contour PLUS BLOOD test strips  Contour Next test strips

## 2023-10-21 ENCOUNTER — Other Ambulatory Visit: Payer: Self-pay | Admitting: Internal Medicine

## 2023-10-21 MED ORDER — ACCU-CHEK GUIDE ME W/DEVICE KIT: 1.0000 | PACK | Freq: Once | 0 refills | Status: AC

## 2023-10-21 MED ORDER — ACCU-CHEK SMARTVIEW VI STRP: ORAL_STRIP | 3 refills | Status: DC

## 2023-10-21 NOTE — Telephone Encounter (Signed)
New meter and test strips sent to pharmacy.

## 2023-10-21 NOTE — Addendum Note (Signed)
 Addended by: KALLIE CLOTILDA SQUIBB on: 10/21/2023 09:57 AM   Modules accepted: Orders

## 2023-11-07 ENCOUNTER — Other Ambulatory Visit: Payer: Self-pay | Admitting: Urology

## 2023-11-07 ENCOUNTER — Ambulatory Visit: Admitting: Urology

## 2023-11-07 DIAGNOSIS — R310 Gross hematuria: Secondary | ICD-10-CM | POA: Diagnosis not present

## 2023-11-07 DIAGNOSIS — D494 Neoplasm of unspecified behavior of bladder: Secondary | ICD-10-CM

## 2023-11-07 MED ORDER — CEPHALEXIN 500 MG PO CAPS
500.0000 mg | ORAL_CAPSULE | Freq: Once | ORAL | Status: AC
  Administered 2023-11-07: 500 mg via ORAL

## 2023-11-07 MED ORDER — LIDOCAINE HCL URETHRAL/MUCOSAL 2 % EX GEL
1.0000 | Freq: Once | CUTANEOUS | Status: AC
  Administered 2023-11-07: 1 via URETHRAL

## 2023-11-07 NOTE — Progress Notes (Signed)
 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 Cystoscopy was performed with the a flexible cystoscope. The urethra, bladder neck and entire bladder was visualized in a standard fashion, and bladder mucosa was grossly normal throughout. The ureteral orifices were visualized in their normal location and orientation.  No abnormalities on retroflexion   Plan: No evidence of recurrence today Cytology ordered, if negative and CT benign proceed with BCG maintenance in August CT ordered for reported gross hematuria RTC 3 to 4 months cystoscopy

## 2023-11-13 LAB — CYTOLOGY PLUS MONITORING PROFILE: PAP & FEULGEN

## 2023-11-19 ENCOUNTER — Ambulatory Visit: Payer: Self-pay | Admitting: Urology

## 2023-11-19 ENCOUNTER — Ambulatory Visit
Admission: RE | Admit: 2023-11-19 | Discharge: 2023-11-19 | Disposition: A | Discharge status: Home / Self Care | Source: Ambulatory Visit | Attending: Urology | Admitting: Urology

## 2023-11-19 DIAGNOSIS — R310 Gross hematuria: Secondary | ICD-10-CM | POA: Insufficient documentation

## 2023-11-19 DIAGNOSIS — N281 Cyst of kidney, acquired: Secondary | ICD-10-CM | POA: Diagnosis not present

## 2023-11-19 DIAGNOSIS — K5730 Diverticulosis of large intestine without perforation or abscess without bleeding: Secondary | ICD-10-CM | POA: Diagnosis not present

## 2023-11-19 MED ORDER — IOHEXOL 300 MG/ML  SOLN
100.0000 mL | Freq: Once | INTRAMUSCULAR | Status: AC | PRN
  Administered 2023-11-19 (×2): 100 mL via INTRAVENOUS

## 2023-11-19 NOTE — Telephone Encounter (Signed)
-----   Message from Redell JAYSON Burnet sent at 11/19/2023 11:31 AM EDT ----- [place] news, CT and cytology normal, no evidence of recurrent bladder cancer.  Like we discussed in clinic will continue with BCG and surveillance cystoscopy.  I messaged Jess reBCG to start in  August, he needs follow-up cystoscopy every 3 to 4 months  Redell Burnet, MD 11/19/2023  ----- Message ----- From: Interface, Rad Results In Sent: 11/19/2023  11:25 AM EDT To: Redell JAYSON Burnet, MD

## 2023-11-19 NOTE — Telephone Encounter (Signed)
 Called pt no answer. Unable to leave message as no updated DPR is on file. 1st attempt.

## 2023-11-21 NOTE — Telephone Encounter (Signed)
 Called pt no answer, unable to leave message. 2nd attempt.

## 2023-11-25 ENCOUNTER — Telehealth: Payer: Self-pay

## 2023-11-25 NOTE — Telephone Encounter (Signed)
-----   Message from Redell JAYSON Burnet sent at 11/19/2023 11:31 AM EDT ----- [place] news, CT and cytology normal, no evidence of recurrent bladder cancer.  Like we discussed in clinic will continue with BCG and surveillance cystoscopy.  I messaged Jess reBCG to start in  August, he needs follow-up cystoscopy every 3 to 4 months  Redell Burnet, MD 11/19/2023  ----- Message ----- From: Interface, Rad Results In Sent: 11/19/2023  11:25 AM EDT To: Redell JAYSON Burnet, MD

## 2023-11-25 NOTE — Telephone Encounter (Signed)
 Called pt informed her of the information below per Dr. Francisca, pt voiced understanding.

## 2023-11-25 NOTE — Telephone Encounter (Signed)
 Copied from CRM #[id number]. Topic: Appointments - Transfer of Care >> Nov 25, 2023  4:08 PM Sasha M wrote: Pt is requesting to transfer FROM: richard letvak Pt is requesting to transfer TO: leron hummer Reason for requested transfer: pcp is retiring and pt wont have transportation any longer to this office It is the responsibility of the team the patient would like to transfer to (Dr. jimmy) to reach out to the patient if for any reason this transfer is not acceptable.

## 2023-12-04 ENCOUNTER — Telehealth: Payer: Self-pay

## 2023-12-04 NOTE — Telephone Encounter (Signed)
 Patient was identified as falling into the [place] Measure - Diabetes.   Patient was: Patient is not currently using our practice.

## 2023-12-12 ENCOUNTER — Ambulatory Visit: Admitting: Physician Assistant

## 2023-12-19 ENCOUNTER — Ambulatory Visit: Admitting: Physician Assistant

## 2023-12-26 ENCOUNTER — Ambulatory Visit: Admitting: Physician Assistant

## 2024-01-03 ENCOUNTER — Other Ambulatory Visit: Payer: Self-pay

## 2024-01-03 NOTE — Telephone Encounter (Signed)
 Pharmacy was asking for new rx for Accu-chek testing supplies. That was sent in in August.

## 2024-01-16 ENCOUNTER — Ambulatory Visit: Admitting: Physician Assistant

## 2024-01-23 ENCOUNTER — Ambulatory Visit: Admitting: Physician Assistant

## 2024-01-30 ENCOUNTER — Ambulatory Visit: Admitting: Physician Assistant

## 2024-02-09 NOTE — Progress Notes (Unsigned)
 BCG Bladder Instillation  BCG # 1/3  Due to Bladder Cancer patient is present today for a BCG treatment. Patient was cleaned and prepped in a sterile fashion with betadine. A 14 FR catheter was inserted, urine return was noted 100 ml, urine was yellow clear in color.  50ml of reconstituted BCG was instilled into the bladder. The catheter was then removed. Patient tolerated well, no complications were noted  Performed by: CLOTILDA CORNWALL, PA-C and Nya E Bynum, CMA    Follow up/ Additional notes: One week for #2/3 BCG

## 2024-02-11 ENCOUNTER — Encounter: Payer: Self-pay | Admitting: Urology

## 2024-02-11 ENCOUNTER — Ambulatory Visit: Admitting: Urology

## 2024-02-11 VITALS — BP 147/78 | HR 75 | Ht 65.0 in | Wt 180.0 lb

## 2024-02-11 DIAGNOSIS — C679 Malignant neoplasm of bladder, unspecified: Secondary | ICD-10-CM | POA: Diagnosis not present

## 2024-02-11 DIAGNOSIS — C672 Malignant neoplasm of lateral wall of bladder: Secondary | ICD-10-CM

## 2024-02-11 LAB — URINALYSIS, COMPLETE
Bilirubin, UA: NEGATIVE
Glucose, UA: NEGATIVE
Ketones, UA: NEGATIVE
Leukocytes,UA: NEGATIVE
Nitrite, UA: NEGATIVE
Protein,UA: NEGATIVE
RBC, UA: NEGATIVE
Specific Gravity, UA: 1.025 (ref 1.005–1.030)
Urobilinogen, Ur: 1 mg/dL (ref 0.2–1.0)
pH, UA: 6 (ref 5.0–7.5)

## 2024-02-11 LAB — MICROSCOPIC EXAMINATION: Epithelial Cells (non renal): >10 /HPF — AB (ref 0–10)

## 2024-02-11 MED ORDER — BCG LIVE 50 MG IS SUSR
3.2400 mL | Freq: Once | INTRAVESICAL | Status: AC
  Administered 2024-02-11: 81 mg via INTRAVESICAL

## 2024-02-16 NOTE — Progress Notes (Unsigned)
 BCG Bladder Instillation  BCG # 2/3  Due to Bladder Cancer patient is present today for a BCG treatment. Patient was cleaned and prepped in a sterile fashion with betadine. A 14 FR catheter was inserted, urine return was noted 40 ml, urine was yellow in color.  50ml of reconstituted BCG was instilled into the bladder. The catheter was then removed. Patient tolerated well, no complications were noted  Performed by: CLOTILDA CORNWALL, PA-C and Nya E Bynum, CMA    Follow up/ Additional notes: One week for #3/3 BCG

## 2024-02-18 ENCOUNTER — Encounter: Payer: Self-pay | Admitting: Urology

## 2024-02-18 ENCOUNTER — Ambulatory Visit: Admitting: Urology

## 2024-02-18 VITALS — BP 137/79 | HR 70 | Ht 65.0 in | Wt 180.0 lb

## 2024-02-18 DIAGNOSIS — C679 Malignant neoplasm of bladder, unspecified: Secondary | ICD-10-CM

## 2024-02-18 DIAGNOSIS — C672 Malignant neoplasm of lateral wall of bladder: Secondary | ICD-10-CM

## 2024-02-18 LAB — URINALYSIS, COMPLETE
Bilirubin, UA: NEGATIVE
Glucose, UA: NEGATIVE
Ketones, UA: NEGATIVE
Leukocytes,UA: NEGATIVE
Nitrite, UA: NEGATIVE
Protein,UA: NEGATIVE
RBC, UA: NEGATIVE
Specific Gravity, UA: 1.015 (ref 1.005–1.030)
Urobilinogen, Ur: 0.2 mg/dL (ref 0.2–1.0)
pH, UA: 6 (ref 5.0–7.5)

## 2024-02-18 LAB — MICROSCOPIC EXAMINATION: Bacteria, UA: NONE SEEN

## 2024-02-18 MED ORDER — BCG LIVE 50 MG IS SUSR
3.2400 mL | Freq: Once | INTRAVESICAL | Status: AC
  Administered 2024-02-18: 81 mg via INTRAVESICAL

## 2024-02-23 NOTE — Progress Notes (Unsigned)
 BCG Bladder Instillation  BCG # 3/3  Due to Bladder Cancer patient is present today for a BCG treatment. Patient was cleaned and prepped in a sterile fashion with betadine. A 14 FR catheter was inserted, urine return was noted 20 ml, urine was yellow in color.  50ml of reconstituted BCG was instilled into the bladder. The catheter was then removed. Patient tolerated well, no complications were noted  Performed by: CLOTILDA CORNWALL, PA-C and Nya E Bynum, CMA    Follow up/ Additional notes: Follow up cystoscopy in 3 months with Dr. Francisca

## 2024-02-25 ENCOUNTER — Encounter: Payer: Self-pay | Admitting: Urology

## 2024-02-25 ENCOUNTER — Ambulatory Visit: Admitting: Urology

## 2024-02-25 VITALS — BP 127/76 | HR 76 | Ht 65.0 in | Wt 180.0 lb

## 2024-02-25 DIAGNOSIS — C679 Malignant neoplasm of bladder, unspecified: Secondary | ICD-10-CM

## 2024-02-25 DIAGNOSIS — C672 Malignant neoplasm of lateral wall of bladder: Secondary | ICD-10-CM

## 2024-02-25 LAB — URINALYSIS, COMPLETE
Bilirubin, UA: NEGATIVE
Glucose, UA: NEGATIVE
Ketones, UA: NEGATIVE
Leukocytes,UA: NEGATIVE
Nitrite, UA: NEGATIVE
Protein,UA: NEGATIVE
RBC, UA: NEGATIVE
Specific Gravity, UA: 1.02 (ref 1.005–1.030)
Urobilinogen, Ur: 0.2 mg/dL (ref 0.2–1.0)
pH, UA: 6 (ref 5.0–7.5)

## 2024-02-25 LAB — MICROSCOPIC EXAMINATION

## 2024-02-25 MED ORDER — BCG LIVE 50 MG IS SUSR
3.2400 mL | Freq: Once | INTRAVESICAL | Status: AC
  Administered 2024-02-25: 81 mg via INTRAVESICAL

## 2024-02-25 NOTE — Patient Instructions (Signed)

## 2024-03-12 ENCOUNTER — Telehealth: Payer: Self-pay

## 2024-03-12 NOTE — Telephone Encounter (Signed)
 Patient was identified as falling into the [place] Measure - Diabetes.   Patient was: Patient is not currently using our practice.She is trying to set up with [facility] station and has an appt in March. She does not have a way to get to [facility] to have A1c in the meantime.

## 2024-03-17 ENCOUNTER — Telehealth: Payer: Self-pay

## 2024-03-17 NOTE — Telephone Encounter (Signed)
 Patient was identified as falling into the [place] Measure - Diabetes.   Patient was: Attribution and/or data issue.  Validation/Investigation needed.  Explanation:  Patient is changing practices to [facility]. She has an appointment in March.

## 2024-04-03 ENCOUNTER — Other Ambulatory Visit: Payer: Self-pay

## 2024-04-03 MED ORDER — PANTOPRAZOLE SODIUM 40 MG PO TBEC: 40.0000 mg | DELAYED_RELEASE_TABLET | Freq: Every day | ORAL | 0 refills | Status: DC

## 2024-05-08 ENCOUNTER — Encounter: Payer: Self-pay | Admitting: Nurse Practitioner

## 2024-05-08 ENCOUNTER — Ambulatory Visit: Admitting: Nurse Practitioner

## 2024-05-08 VITALS — BP 126/82 | HR 136 | Temp 97.5°F | Ht 65.0 in | Wt 182.2 lb

## 2024-05-08 DIAGNOSIS — C672 Malignant neoplasm of lateral wall of bladder: Secondary | ICD-10-CM

## 2024-05-08 DIAGNOSIS — E785 Hyperlipidemia, unspecified: Secondary | ICD-10-CM

## 2024-05-08 DIAGNOSIS — E1159 Type 2 diabetes mellitus with other circulatory complications: Secondary | ICD-10-CM

## 2024-05-08 DIAGNOSIS — K219 Gastro-esophageal reflux disease without esophagitis: Secondary | ICD-10-CM | POA: Diagnosis not present

## 2024-05-08 DIAGNOSIS — Z7984 Long term (current) use of oral hypoglycemic drugs: Secondary | ICD-10-CM | POA: Diagnosis not present

## 2024-05-08 DIAGNOSIS — Z23 Encounter for immunization: Secondary | ICD-10-CM

## 2024-05-08 DIAGNOSIS — I10 Essential (primary) hypertension: Secondary | ICD-10-CM

## 2024-05-08 LAB — COMPREHENSIVE METABOLIC PANEL WITH GFR
ALT: 29 U/L (ref 3–35)
AST: 32 U/L (ref 5–37)
Albumin: 4.6 g/dL (ref 3.5–5.2)
Alkaline Phosphatase: 65 U/L (ref 39–117)
BUN: 14 mg/dL (ref 6–23)
CO2: 26 meq/L (ref 19–32)
Calcium: 10.3 mg/dL (ref 8.4–10.5)
Chloride: 101 meq/L (ref 96–112)
Creatinine, Ser: 0.78 mg/dL (ref 0.40–1.20)
GFR: 73.63 mL/min
Glucose, Bld: 160 mg/dL — ABNORMAL HIGH (ref 70–99)
Potassium: 4.1 meq/L (ref 3.5–5.1)
Sodium: 138 meq/L (ref 135–145)
Total Bilirubin: 0.3 mg/dL (ref 0.2–1.2)
Total Protein: 8.1 g/dL (ref 6.0–8.3)

## 2024-05-08 LAB — MICROALBUMIN / CREATININE URINE RATIO
Creatinine,U: 22.8 mg/dL
Microalb Creat Ratio: 632 mg/g — ABNORMAL HIGH (ref 0.0–30.0)
Microalb, Ur: 14.4 mg/dL — ABNORMAL HIGH (ref 0.7–1.9)

## 2024-05-08 LAB — HEMOGLOBIN A1C: Hgb A1c MFr Bld: 7.8 % — ABNORMAL HIGH (ref 4.6–6.5)

## 2024-05-08 LAB — LIPID PANEL
Cholesterol: 144 mg/dL (ref 28–200)
HDL: 44.9 mg/dL
LDL Cholesterol: 75 mg/dL (ref 10–99)
NonHDL: 99.06
Total CHOL/HDL Ratio: 3
Triglycerides: 121 mg/dL (ref 10.0–149.0)
VLDL: 24.2 mg/dL (ref 0.0–40.0)

## 2024-05-08 MED ORDER — PANTOPRAZOLE SODIUM 40 MG PO TBEC: 40.0000 mg | DELAYED_RELEASE_TABLET | Freq: Every day | ORAL | 1 refills | Status: AC

## 2024-05-08 MED ORDER — LOSARTAN POTASSIUM-HCTZ 50-12.5 MG PO TABS: 1.0000 | ORAL_TABLET | Freq: Every day | ORAL | 3 refills | Status: AC

## 2024-05-08 MED ORDER — GLIPIZIDE ER 5 MG PO TB24: 5.0000 mg | ORAL_TABLET | Freq: Every day | ORAL | 3 refills | Status: AC

## 2024-05-08 MED ORDER — METFORMIN HCL 1000 MG PO TABS: 1000.0000 mg | ORAL_TABLET | Freq: Two times a day (BID) | ORAL | 3 refills | Status: AC

## 2024-05-08 NOTE — Assessment & Plan Note (Addendum)
 Diagnosed in 2024, treated with BCG bladder instillation. No current urinary issues. - Continue follow-up with urology as scheduled.

## 2024-05-08 NOTE — Assessment & Plan Note (Signed)
 Managed with glipizide  and metformin . No recent glucose monitoring due to lack of lancets. No neuropathy symptoms. Lab Results  Component Value Date   HGBA1C 8.2 (H) 09/11/2023  - Continue glipizide  5 mg and metformin  1000 mg twice a day. Refill medication.  - Will check HgA1c.

## 2024-05-08 NOTE — Assessment & Plan Note (Signed)
 Managed with pantoprazole . Previous discontinuation attempt led to symptom recurrence. - Continue pantoprazole  40 mg daily.

## 2024-05-08 NOTE — Assessment & Plan Note (Signed)
 Managed with lipitor. Lab Results  Component Value Date   CHOL 127 09/11/2023   HDL 40.40 09/11/2023   LDLCALC 70 09/11/2023   TRIG 81.0 09/11/2023   CHOLHDL 3 09/11/2023  - Continue lipitor 40  mg daily.

## 2024-05-08 NOTE — Assessment & Plan Note (Signed)
 On glipizide   5 mg and metformin  1000 mg daily. Lab Results  Component Value Date   HGBA1C 8.2 (H) 09/11/2023  - Continue glipizide  and metformin .

## 2024-05-14 ENCOUNTER — Ambulatory Visit: Payer: Self-pay | Admitting: Nurse Practitioner

## 2024-05-14 NOTE — Progress Notes (Signed)
 Please call and inform patient: Electrolytes, liver, kidney functions and cholesterol normal. A1c decreased from 8.2 to 7.8.  Continue glipizide , metformin , diet management and exercise. Kidneys are leaking protein  advise patient to focus on improving A1c goal between 7 to 7.5% and maintaining blood pressure goal less than 130/80. Continue diet management, exercise and medication compliance.

## 2024-05-27 ENCOUNTER — Other Ambulatory Visit: Admitting: Urology

## 2024-06-26 ENCOUNTER — Ambulatory Visit: Admitting: Nurse Practitioner

## 2024-08-20 ENCOUNTER — Ambulatory Visit

## 2024-08-28 ENCOUNTER — Ambulatory Visit

## 2024-11-05 ENCOUNTER — Ambulatory Visit: Admitting: Nurse Practitioner
